# Patient Record
Sex: Male | Born: 1960
Health system: Southern US, Community
[De-identification: ages and names within clinical notes are randomized; demographics above are authoritative.]

## PROBLEM LIST (undated history)

## (undated) DIAGNOSIS — H548 Legal blindness, as defined in USA: Secondary | ICD-10-CM

## (undated) DIAGNOSIS — N200 Calculus of kidney: Secondary | ICD-10-CM

## (undated) DIAGNOSIS — E785 Hyperlipidemia, unspecified: Secondary | ICD-10-CM

## (undated) DIAGNOSIS — M1990 Unspecified osteoarthritis, unspecified site: Secondary | ICD-10-CM

## (undated) DIAGNOSIS — I2510 Atherosclerotic heart disease of native coronary artery without angina pectoris: Secondary | ICD-10-CM

## (undated) DIAGNOSIS — T4145XA Adverse effect of unspecified anesthetic, initial encounter: Secondary | ICD-10-CM

## (undated) DIAGNOSIS — L659 Nonscarring hair loss, unspecified: Secondary | ICD-10-CM

## (undated) DIAGNOSIS — K219 Gastro-esophageal reflux disease without esophagitis: Secondary | ICD-10-CM

## (undated) DIAGNOSIS — R002 Palpitations: Secondary | ICD-10-CM

## (undated) DIAGNOSIS — Z8701 Personal history of pneumonia (recurrent): Secondary | ICD-10-CM

## (undated) DIAGNOSIS — H409 Unspecified glaucoma: Secondary | ICD-10-CM

## (undated) DIAGNOSIS — G43409 Hemiplegic migraine, not intractable, without status migrainosus: Secondary | ICD-10-CM

## (undated) DIAGNOSIS — I10 Essential (primary) hypertension: Secondary | ICD-10-CM

## (undated) DIAGNOSIS — G4730 Sleep apnea, unspecified: Secondary | ICD-10-CM

## (undated) DIAGNOSIS — J189 Pneumonia, unspecified organism: Secondary | ICD-10-CM

## (undated) DIAGNOSIS — T8859XA Other complications of anesthesia, initial encounter: Secondary | ICD-10-CM

## (undated) HISTORY — DX: Personal history of pneumonia (recurrent): Z87.01

## (undated) HISTORY — PX: DG GREAT TOE RIGHT FOOT: HXRAD1657

## (undated) HISTORY — DX: Hemiplegic migraine, not intractable, without status migrainosus: G43.409

## (undated) HISTORY — DX: Calculus of kidney: N20.0

## (undated) HISTORY — DX: Essential (primary) hypertension: I10

## (undated) HISTORY — DX: Atherosclerotic heart disease of native coronary artery without angina pectoris: I25.10

## (undated) HISTORY — PX: LUMBAR LAMINECTOMY: SHX95

## (undated) HISTORY — DX: Hyperlipidemia, unspecified: E78.5

## (undated) HISTORY — DX: Palpitations: R00.2

## (undated) HISTORY — DX: Unspecified glaucoma: H40.9

## (undated) HISTORY — PX: BACK SURGERY: SHX140

---

## 1987-08-05 DIAGNOSIS — H548 Legal blindness, as defined in USA: Secondary | ICD-10-CM

## 1987-08-05 HISTORY — PX: EYE SURGERY: SHX253

## 1987-08-05 HISTORY — DX: Legal blindness, as defined in USA: H54.8

## 1988-08-04 HISTORY — PX: NASAL SEPTUM SURGERY: SHX37

## 1999-02-23 ENCOUNTER — Encounter: Payer: Self-pay | Admitting: Emergency Medicine

## 1999-02-23 ENCOUNTER — Emergency Department (HOSPITAL_COMMUNITY): Admission: EM | Admit: 1999-02-23 | Discharge: 1999-02-23 | Payer: Self-pay | Admitting: Emergency Medicine

## 2000-12-29 ENCOUNTER — Emergency Department (HOSPITAL_COMMUNITY): Admission: EM | Admit: 2000-12-29 | Discharge: 2000-12-30 | Payer: Self-pay | Admitting: Emergency Medicine

## 2003-09-20 ENCOUNTER — Emergency Department (HOSPITAL_COMMUNITY): Admission: EM | Admit: 2003-09-20 | Discharge: 2003-09-20 | Payer: Self-pay | Admitting: Emergency Medicine

## 2004-07-10 ENCOUNTER — Ambulatory Visit: Payer: Self-pay | Admitting: Family Medicine

## 2004-07-16 ENCOUNTER — Ambulatory Visit: Payer: Self-pay | Admitting: Family Medicine

## 2004-09-10 ENCOUNTER — Ambulatory Visit: Payer: Self-pay | Admitting: Family Medicine

## 2004-10-08 ENCOUNTER — Ambulatory Visit: Payer: Self-pay | Admitting: Family Medicine

## 2004-12-18 ENCOUNTER — Ambulatory Visit: Payer: Self-pay | Admitting: Family Medicine

## 2005-01-09 ENCOUNTER — Ambulatory Visit: Payer: Self-pay | Admitting: Family Medicine

## 2005-02-17 ENCOUNTER — Ambulatory Visit: Payer: Self-pay | Admitting: Family Medicine

## 2005-04-08 ENCOUNTER — Ambulatory Visit: Payer: Self-pay | Admitting: Family Medicine

## 2005-07-31 ENCOUNTER — Ambulatory Visit: Payer: Self-pay | Admitting: Family Medicine

## 2005-08-19 ENCOUNTER — Emergency Department (HOSPITAL_COMMUNITY): Admission: EM | Admit: 2005-08-19 | Discharge: 2005-08-20 | Payer: Self-pay | Admitting: Emergency Medicine

## 2005-08-21 ENCOUNTER — Ambulatory Visit: Payer: Self-pay | Admitting: Family Medicine

## 2005-09-05 ENCOUNTER — Ambulatory Visit: Payer: Self-pay | Admitting: Family Medicine

## 2008-12-01 ENCOUNTER — Encounter: Admission: RE | Admit: 2008-12-01 | Discharge: 2008-12-01 | Payer: Self-pay | Admitting: Orthopedic Surgery

## 2010-06-14 ENCOUNTER — Ambulatory Visit: Payer: Self-pay | Admitting: Cardiology

## 2010-06-14 ENCOUNTER — Inpatient Hospital Stay (HOSPITAL_COMMUNITY): Admission: EM | Admit: 2010-06-14 | Discharge: 2010-06-15 | Payer: Self-pay | Admitting: Emergency Medicine

## 2010-06-15 ENCOUNTER — Encounter (INDEPENDENT_AMBULATORY_CARE_PROVIDER_SITE_OTHER): Payer: Self-pay | Admitting: Neurology

## 2010-06-20 ENCOUNTER — Ambulatory Visit (HOSPITAL_COMMUNITY): Admission: RE | Admit: 2010-06-20 | Discharge: 2010-06-20 | Payer: Self-pay | Admitting: Neurology

## 2010-06-20 ENCOUNTER — Encounter (INDEPENDENT_AMBULATORY_CARE_PROVIDER_SITE_OTHER): Payer: Self-pay | Admitting: Neurology

## 2010-09-09 ENCOUNTER — Emergency Department (HOSPITAL_COMMUNITY): Payer: 59

## 2010-09-09 ENCOUNTER — Other Ambulatory Visit: Payer: Self-pay | Admitting: Emergency Medicine

## 2010-09-09 ENCOUNTER — Observation Stay (HOSPITAL_COMMUNITY)
Admission: EM | Admit: 2010-09-09 | Discharge: 2010-09-11 | DRG: 069 | Disposition: A | Discharge status: Home / Self Care | Payer: 59 | Attending: Internal Medicine | Admitting: Internal Medicine

## 2010-09-09 DIAGNOSIS — G459 Transient cerebral ischemic attack, unspecified: Principal | ICD-10-CM | POA: Insufficient documentation

## 2010-09-09 DIAGNOSIS — R079 Chest pain, unspecified: Secondary | ICD-10-CM | POA: Insufficient documentation

## 2010-09-09 DIAGNOSIS — N529 Male erectile dysfunction, unspecified: Secondary | ICD-10-CM | POA: Insufficient documentation

## 2010-09-09 DIAGNOSIS — F3289 Other specified depressive episodes: Secondary | ICD-10-CM | POA: Insufficient documentation

## 2010-09-09 DIAGNOSIS — R51 Headache: Secondary | ICD-10-CM | POA: Insufficient documentation

## 2010-09-09 DIAGNOSIS — J329 Chronic sinusitis, unspecified: Secondary | ICD-10-CM | POA: Insufficient documentation

## 2010-09-09 DIAGNOSIS — F329 Major depressive disorder, single episode, unspecified: Secondary | ICD-10-CM | POA: Insufficient documentation

## 2010-09-09 DIAGNOSIS — I10 Essential (primary) hypertension: Secondary | ICD-10-CM | POA: Insufficient documentation

## 2010-09-09 DIAGNOSIS — H409 Unspecified glaucoma: Secondary | ICD-10-CM | POA: Insufficient documentation

## 2010-09-09 DIAGNOSIS — R002 Palpitations: Secondary | ICD-10-CM | POA: Insufficient documentation

## 2010-09-09 DIAGNOSIS — E785 Hyperlipidemia, unspecified: Secondary | ICD-10-CM | POA: Insufficient documentation

## 2010-09-09 LAB — HEMOGLOBIN A1C: Mean Plasma Glucose: 117 mg/dL — ABNORMAL HIGH (ref <117)

## 2010-09-09 LAB — COMPREHENSIVE METABOLIC PANEL
Albumin: 3.7 g/dL (ref 3.5–5.2)
Alkaline Phosphatase: 49 U/L (ref 39–117)
BUN: 15 mg/dL (ref 6–23)
CO2: 25 mEq/L (ref 19–32)
Chloride: 103 mEq/L (ref 96–112)
GFR calc non Af Amer: >60 mL/min (ref >60)
Glucose, Bld: 86 mg/dL (ref 70–99)
Potassium: 3.7 mEq/L (ref 3.5–5.1)
Total Bilirubin: 0.4 mg/dL (ref 0.3–1.2)

## 2010-09-09 LAB — POCT CARDIAC MARKERS
CKMB, poc: 4.3 ng/mL (ref 1.0–8.0)
Troponin i, poc: <0.05 ng/mL (ref 0.00–0.09)

## 2010-09-09 LAB — POCT I-STAT, CHEM 8
Calcium, Ion: 1.22 mmol/L (ref 1.12–1.32)
Chloride: 104 mEq/L (ref 96–112)
Creatinine, Ser: 1.3 mg/dL (ref 0.4–1.5)
Glucose, Bld: 84 mg/dL (ref 70–99)
Potassium: 3.5 mEq/L (ref 3.5–5.1)

## 2010-09-09 LAB — CK TOTAL AND CKMB (NOT AT ARMC)
CK, MB: 9.2 ng/mL (ref 0.3–4.0)
Total CK: 194 U/L (ref 7–232)

## 2010-09-09 LAB — PROTIME-INR
INR: 0.98 (ref 0.00–1.49)
Prothrombin Time: 13.2 seconds (ref 11.6–15.2)

## 2010-09-09 LAB — APTT: aPTT: 25 seconds (ref 24–37)

## 2010-09-10 DIAGNOSIS — G459 Transient cerebral ischemic attack, unspecified: Secondary | ICD-10-CM

## 2010-09-10 LAB — CARDIAC PANEL(CRET KIN+CKTOT+MB+TROPI)
CK, MB: 9 ng/mL (ref 0.3–4.0)
Relative Index: 4.5 — ABNORMAL HIGH (ref 0.0–2.5)
Relative Index: 5 — ABNORMAL HIGH (ref 0.0–2.5)
Total CK: 180 U/L (ref 7–232)
Troponin I: 0.01 ng/mL (ref 0.00–0.06)

## 2010-09-10 LAB — CBC
HCT: 41.3 % (ref 39.0–52.0)
Hemoglobin: 14.5 g/dL (ref 13.0–17.0)
MCHC: 35.1 g/dL (ref 30.0–36.0)

## 2010-09-10 LAB — BASIC METABOLIC PANEL
CO2: 27 mEq/L (ref 19–32)
Calcium: 9.1 mg/dL (ref 8.4–10.5)
Glucose, Bld: 88 mg/dL (ref 70–99)
Sodium: 137 mEq/L (ref 135–145)

## 2010-09-10 LAB — LIPID PANEL
HDL: 36 mg/dL — ABNORMAL LOW (ref >39)
Triglycerides: 121 mg/dL (ref <150)
VLDL: 24 mg/dL (ref 0–40)

## 2010-09-11 DIAGNOSIS — I6789 Other cerebrovascular disease: Secondary | ICD-10-CM

## 2010-09-11 LAB — BASIC METABOLIC PANEL
Calcium: 9.3 mg/dL (ref 8.4–10.5)
GFR calc Af Amer: >60 mL/min (ref >60)
GFR calc non Af Amer: >60 mL/min (ref >60)
Sodium: 139 mEq/L (ref 135–145)

## 2010-09-11 LAB — CARDIAC PANEL(CRET KIN+CKTOT+MB+TROPI)
CK, MB: 6.8 ng/mL (ref 0.3–4.0)
Total CK: 132 U/L (ref 7–232)
Troponin I: 0.01 ng/mL (ref 0.00–0.06)

## 2010-09-24 NOTE — Discharge Summary (Signed)
[name], [name]           ACCOUNT NO.:  [id number]  MEDICAL RECORD NO.:  [id number]           PATIENT TYPE:  I  LOCATION:  3731                         FACILITY:  [facility]  PHYSICIAN:  Doneen Poisson, MD     DATE OF BIRTH:  January 07, 1961  DATE OF ADMISSION:  09/09/2010 DATE OF DISCHARGE:  09/11/2010                              DISCHARGE SUMMARY   DISCHARGE DIAGNOSES: 1. Transient ischemic attacks, recurrent. 2. Hypertension. 3. Hyperlipidemia. 4. Glaucoma. 5. Chronic sinusitis.  DISCHARGE MEDICATIONS: 1. Plavix 75 mg p.o. daily. 2. Hydrochlorothiazide 12.5 mg p.o. daily. 3. Famotidine 20 mg p.o. daily. 4. Fenofibrate 145 mg p.o. daily. 5. Fish oil 1200 mg p.o. daily. 6. Ibuprofen 200 mg 3 tablets p.o. daily. 7. Simvastatin 40 mg p.o. daily. 8. Singulair 10 mg p.o. daily. 9. Xalatan ophthalmic drops 1 drop in each eye daily at bedtime.  DISPOSITION AND FOLLOWUP:  [name] was discharged from [facility] on September 11, 2010, in stable and improved condition.  He had no residual weakness, focal neurologic signs, or chest pain.  He will follow up with Dr. Pearlean Brownie at [facility] for management of recurrent TIAs.  Following discharge, he will be set up with a cardiac event monitor to further evaluate his history of palpitations in the setting of recurrent TIAs.  He was also informed that he should undergo repeat cardiac stress testing in the next few months.  He will also follow up with Prudy Feeler, PA of [facility] to establish care with a primary care provider. At that time, his blood pressure control, need for cardiac stress testing and any ongoing depressive symptoms or sexual dysfunction should be addressed.  CONSULTANTS:  Neurology, Joycelyn Schmid, MD and Pramod P. Pearlean Brownie, MD  PROCEDURES PERFORMED: 1. Portable chest x-ray on February 6, which demonstrated no acute     cardiopulmonary findings. 2. MRI of brain on February  6, which demonstrated no acute infarct,     but minimal nonspecific white matter-type changes, mild global     atrophy and polypoid opacification of the inferior aspect of the     left maxillary sinus.  Mild mucosal thickening of the ethmoid sinus     air cells also observed. 3. MRA of head on February 6, which demonstrated mild branch vessel     irregularity.  ADMISSION HISTORY:  [name] is a 50 year old man with a past medical history of hypertension, hyperlipidemia, and recurrent TIAs who came to the ED for left-sided extremity weakness.  He had been working at approximately 12:30 in the afternoon on that day when he had an episode of sudden-onset left upper and lower extremity weakness, which lasted approximately 40 minutes and resolved spontaneously.  At that time, he also had mild headache, no dizziness, slurred speech, seizure, or syncope.  He was concerned because he had 2 previous TIAs in November and December 2011, and therefore went to the ED.  He had a full work up including MRI and MRA and intracranial Doppler in November 2011, which were unremarkable and had been followed by Dr. Pearlean Brownie at [facility].  After his arrival at the  ED, his weakness resolved, but he started to have left-sided chest pain, intermittent, lasting about 5 minutes without any precipitating factors.  The pain was approximately 5/10, pressure like, also associated with palpitation but no radiation, diaphoresis, nausea or vomiting, fever or cough.  No abdominal pain, diarrhea, or dysuria.  He denied smoking, alcohol or drug abuse.  ADMITTING PHYSICAL EXAMINATION: VITAL SIGNS:  Blood pressure 137/95, pulse 125, respiratory rate 22,  temperature 98.3, oxygen saturation 96% on room air. GENERAL:  No acute distress.  Normal speech. HEAD:  Normocephalic, atraumatic. EYES:  Left pupil responded.  Right pupil nonreactive secondary to a remote retinal tear.  Extraocular motions intact.   No icterus or pallor. NECK:  Supple. LUNGS:  Bibasilar fine crackles.  Poor inspiratory effort.  No increased work of breathing. CARDIOVASCULAR:  Regular rate and rhythm.  No murmurs, rubs, or gallops. Normal S1 and S2. ABDOMEN:  Soft, nontender, nondistended.  Normal bowel sounds.  No palpable masses. EXTREMITIES:  No edema or rash. NEUROLOGIC:  Alert and oriented.  Muscle strength 5/5 in all extremities.  Sensation and deep tendon reflexes unremarkable. SKIN:  No rash or lesions.  ADMITTING LABORATORY DATA:  Sodium 140, potassium 3.7, chloride 103, bicarb 25, BUN 15, creatinine 1.06, glucose 86, bilirubin 0.4, alkaline phosphatase 49, AST 23, ALT 31, total protein 6.2, albumin 3.7, calcium 9.2.  White blood cell count 9.2, hemoglobin 14.5, hematocrit 41.3, platelet count 256.  HOSPITAL COURSE: 1. Recurrent TIAs.  [name] presented to the emergency department     with symptoms of acute left upper and lower extremity weakness that     resolved spontaneously after approximately 40 minutes.  MRI and MRA     of head were negative for acute infarct.  Carotid Dopplers and 2-D     echo were also unremarkable.  He was monitored on telemetry     throughout admission and remained in normal sinus rhythm without     significant events.  He had previously been hospitalized in     November 2011 with similar TIA symptoms including left-sided     weakness and slurred speech.  He also reported an episode in     December 2011 with similar symptoms that lasted only a few minutes.     Given the history of recurrent TIAs and his report of occasional      palpitations at home, he underwent a transesophageal     echocardiogram that demonstrated no significant abnormalities.     Prior to admission, he had been participating in the Point     Trial for the past few months, which involved administration of     daily aspirin and study drug, which was either Plavix or placebo.     Per Dr. Pearlean Brownie, he  was removed from the [facility] Trial,     stopped taking the study drug and was started on Plavix without     aspirin as he had presumably failed aspirin therapy.     Although, it is unknown whether he had been receiving     Plavix and aspirin or aspirin alone through Point Trial.  It was     decided that unblinding of his assignment in the trial was not     necessary as there is currently no therapy superior to Plavix to     offer the patients for stroke and TIA prevention.  Following     discharge, he will follow up with Dr. Pearlean Brownie in clinic for further  management of recurrent TIAs.  2. Palpitations.  [name] reports occasional subjective     palpitations at home.  On admission, he was tachycardic with a     heart rate of 125, however, his rate returned to normal before the     rhythm could be analyzed.  EKG demonstrated normal sinus rhythm and     he remained in normal sinus rhythm on telemetry throughout     admission, although transesophageal echocardiogram was     unremarkable.  Given the history of recurrent TIAs and subjective     palpitations, it was decided he would benefit from further     cardiac monitoring. Following discharge, arrangements will be made     for him to be set up with a non-looping cardiac event monitor.  3. Chest pain.  While in the ED, he complained of left-sided     pressure-like chest pain that was intermittent.  He had no evidence      of ischemia on EKG and cardiac enzymes were negative x3.  He     reported a negative exercise stress test that was performed     several years ago at [facility].  Given numerous risk factors     including apparent cerebrovascular disease, he was advised     to undergo repeat exercise stress testing after completion of     cardiac event monitoring.  4. Hypertension.  Antihypertensives were initially held on admission     in the context of TIA symptoms.  His blood pressure remained only     slightly elevated despite  receiving no antihypertensives.  The     patient reported occasional dizziness at home and it was felt that     his previous regimen of losartan/HCTZ 100/25 mg, may be more     aggressive than he currently needed for blood pressure control.       He was discharged on a reduced dose of HCTZ alone.  Blood pressure     control should be further addressed by his primary care provider on     follow up.  5. Hyperlipidemia.  [name] was continued on statin therapy     throughout admission and at discharge.  Fasting lipid panel     demonstrated total cholesterol 157, HDL 37, LDL 42, and     triglycerides 332.  6. Depressive symptoms.  Prior to discharge, he and his     family expressed concerns that he may be suffering from depression.     He endorsed frequent irritability, poor sleep, and difficulty with     stress related to his job.  The possibility of starting an SSRI was     discussed with him, and he elected to think about it and     follow up with his primary care doctor.  7. Erectile dysfunction.  On the evening of discharge, [name]     reported concerns related to erectile dysfunction and low sexual     desire over the past several months.  Possible etiologies including     depression, vascular disease, and medications were discussed with     him.  He was advised to wait on trying medications     such as Viagra until his cardiac work up was completed.  This     problem should be further addressed by his primary care provider at     follow up.  DISCHARGE VITALS:  Temperature 98.1, blood pressure 118/62, pulse 76, respiratory rate  18, oxygen saturation 96% on room air.  DISCHARGE LABORATORIES:  Sodium 139, potassium 3.7, chloride 105, bicarb 28, BUN 13, creatinine 1.11, glucose 109, calcium 9.3.   ______________________________ Whitney Post, MD   ______________________________ Doneen Poisson, MD   EB/MEDQ  D:  09/15/2010  T:  09/16/2010  Job:  [id number]  cc:    Pramod P. Pearlean Brownie, MD Prudy Feeler, PA  Electronically Signed by Whitney Post MD on 09/22/2010 08:41:04 PM Electronically Signed by Doneen Poisson  on 09/24/2010 06:55:43 AM
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
## 2010-10-15 LAB — DIFFERENTIAL
Basophils Relative: 0 % (ref 0–1)
Eosinophils Absolute: 0.2 10*3/uL (ref 0.0–0.7)
Eosinophils Relative: 3 % (ref 0–5)
Monocytes Absolute: 0.7 10*3/uL (ref 0.1–1.0)
Monocytes Relative: 9 % (ref 3–12)

## 2010-10-15 LAB — LIPID PANEL
HDL: 37 mg/dL — ABNORMAL LOW (ref >39)
Total CHOL/HDL Ratio: 4.2 RATIO

## 2010-10-15 LAB — COMPREHENSIVE METABOLIC PANEL
ALT: 36 U/L (ref 0–53)
Albumin: 3.7 g/dL (ref 3.5–5.2)
Alkaline Phosphatase: 79 U/L (ref 39–117)
Potassium: 3.8 mEq/L (ref 3.5–5.1)
Sodium: 137 mEq/L (ref 135–145)
Total Protein: 6.1 g/dL (ref 6.0–8.3)

## 2010-10-15 LAB — CARDIAC PANEL(CRET KIN+CKTOT+MB+TROPI)
CK, MB: 8.8 ng/mL (ref 0.3–4.0)
CK, MB: 9.9 ng/mL (ref 0.3–4.0)

## 2010-10-15 LAB — CBC
MCH: 28.2 pg (ref 26.0–34.0)
MCHC: 35.4 g/dL (ref 30.0–36.0)
Platelets: 210 10*3/uL (ref 150–400)

## 2010-10-15 LAB — HEMOGLOBIN A1C: Mean Plasma Glucose: 114 mg/dL (ref <117)

## 2010-10-15 LAB — URINALYSIS, ROUTINE W REFLEX MICROSCOPIC
Ketones, ur: NEGATIVE mg/dL
Nitrite: NEGATIVE
Specific Gravity, Urine: 1.024 (ref 1.005–1.030)
pH: 6.5 (ref 5.0–8.0)

## 2010-10-15 LAB — CK TOTAL AND CKMB (NOT AT ARMC)
CK, MB: 12.2 ng/mL (ref 0.3–4.0)
Relative Index: 4.9 — ABNORMAL HIGH (ref 0.0–2.5)

## 2010-11-18 LAB — DIFFERENTIAL

## 2010-11-18 LAB — CBC
MCV: 80.8 fL (ref 78.0–100.0)
Platelets: 275 10*3/uL (ref 150–400)
RBC: 5.42 MIL/uL (ref 4.22–5.81)
RDW: 12.6 % (ref 11.5–15.5)
WBC: 9.9 10*3/uL (ref 4.0–10.5)

## 2010-11-18 LAB — D-DIMER, QUANTITATIVE: D-Dimer, Quant: <0.22 ug/mL-FEU (ref 0.00–0.48)

## 2011-06-19 ENCOUNTER — Encounter: Payer: Self-pay | Admitting: Internal Medicine

## 2011-06-20 ENCOUNTER — Ambulatory Visit (INDEPENDENT_AMBULATORY_CARE_PROVIDER_SITE_OTHER): Payer: 59 | Admitting: Internal Medicine

## 2011-06-20 ENCOUNTER — Encounter: Payer: Self-pay | Admitting: Internal Medicine

## 2011-06-20 VITALS — BP 130/108 | HR 78 | Temp 98.0°F | Ht 69.0 in | Wt 225.0 lb

## 2011-06-20 DIAGNOSIS — R05 Cough: Secondary | ICD-10-CM

## 2011-06-20 DIAGNOSIS — R059 Cough, unspecified: Secondary | ICD-10-CM

## 2011-06-20 MED ORDER — FAMOTIDINE 20 MG PO TABS: ORAL_TABLET | ORAL | Status: DC

## 2011-06-20 MED ORDER — TRAMADOL HCL 50 MG PO TABS: ORAL_TABLET | ORAL | Status: AC

## 2011-06-20 MED ORDER — PANTOPRAZOLE SODIUM 40 MG PO TBEC: 40.0000 mg | DELAYED_RELEASE_TABLET | Freq: Every day | ORAL | Status: DC

## 2011-06-20 NOTE — Patient Instructions (Signed)
Take delsym two tsp every 12 hours and supplement if needed with  tramadol 50 mg up to 2 every 4 hours to suppress the urge to cough. Swallowing water or using ice chips/non mint and menthol containing candies (such as lifesavers or sugarless jolly ranchers) are also effective.  You should rest your voice and avoid activities that you know make you cough.  Once you have eliminated the cough for 3 straight days try reducing the tramadol first,  then the delsym as tolerated.    Finish your prednisone  Try protonix 40 mg  Take 30-60 min before first meal of the day and Pepcid 20 mg one bedtime until return  GERD (REFLUX)  is an extremely common cause of respiratory symptoms, many times with no significant heartburn at all.    It can be treated with medication, but also with lifestyle changes including avoidance of late meals, excessive alcohol, smoking cessation, and avoid fatty foods, chocolate, peppermint, colas, red wine, and acidic juices such as orange juice.  NO MINT OR MENTHOL PRODUCTS SO NO COUGH DROPS  USE SUGARLESS CANDY INSTEAD (jolley ranchers or Stover's)  NO OIL BASED VITAMINS - use powdered substitutes.  NO FISH OIL, minimize motrin  Stop fish oil  Please schedule a follow up office visit in 2 weeks, sooner if needed
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
## 2011-06-22 DIAGNOSIS — R05 Cough: Secondary | ICD-10-CM | POA: Insufficient documentation

## 2011-06-22 DIAGNOSIS — R059 Cough, unspecified: Secondary | ICD-10-CM | POA: Insufficient documentation

## 2011-06-22 NOTE — Assessment & Plan Note (Signed)
The most common causes of chronic cough in immunocompetent adults include the following: upper airway cough syndrome (UACS), previously referred to as postnasal drip syndrome (PNDS), which is caused by variety of rhinosinus conditions; (2) asthma; (3) GERD; (4) chronic bronchitis from cigarette smoking or other inhaled environmental irritants; (5) nonasthmatic eosinophilic bronchitis; and (6) bronchiectasis.   These conditions, singly or in combination, have accounted for up to 94% of the causes of chronic cough in prospective studies.   Other conditions have constituted no >6% of the causes in prospective studies These have included bronchogenic carcinoma, chronic interstitial pneumonia, sarcoidosis, left ventricular failure, ACEI-induced cough, and aspiration from a condition associated with pharyngeal dysfunction.  Of the three most common causes of chronic cough, only one (GERD)  can actually cause the other two (asthma and post nasal drip syndrome)  and perpetuate the cylce of cough inducing airway trauma, inflammation, heightened sensitivity to reflux which is prompted by the cough itself via a cyclical mechanism.    This may partially respond to steroids and look like asthma and post nasal drainage but never erradicated completely unless the cough and the secondary reflux are eliminated, preferably both at the same time.  While not intuitively obvious, many patients with chronic low grade reflux do not cough until there is a secondary insult that disturbs the protective epithelial barrier and exposes sensitive nerve endings.  This can be viral or direct physical injury such as with an endotracheal tube.   The point is that once this occurs, it is difficult to eliminate using anything but a maximally effective acid suppression regimen at least in the short run, accompanied by an appropriate diet to address non acid GERD.   See instructions for specific recommendations which were reviewed directly  with the patient who was given a copy with highlighter outlining the key components.  

## 2011-08-29 ENCOUNTER — Other Ambulatory Visit: Payer: Self-pay | Admitting: Internal Medicine

## 2011-11-01 IMAGING — CT CT HEAD W/O CM
1 series · 16 of 30 positions shown, 20 images · non-contrast
Comparison: None.

CLINICAL DATA: Code stroke.  Left arm weakness and left sided
heaviness.

CT HEAD WITHOUT CONTRAST
TECHNIQUE: Contiguous axial images were obtained from the base of
the skull through the vertex without contrast.

[Series 2: head routine 4.8 h37s · axial · 0.47mm/px · z∈[-111,+44]mm · 16 of 36 slices shown, 20 images]
[im 2/36  brain]
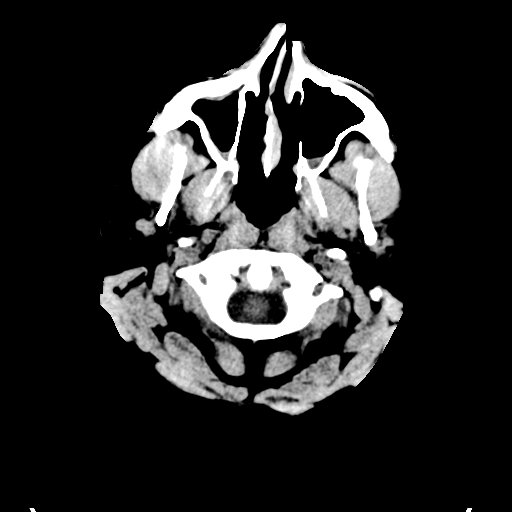
[im 2/36  bone]
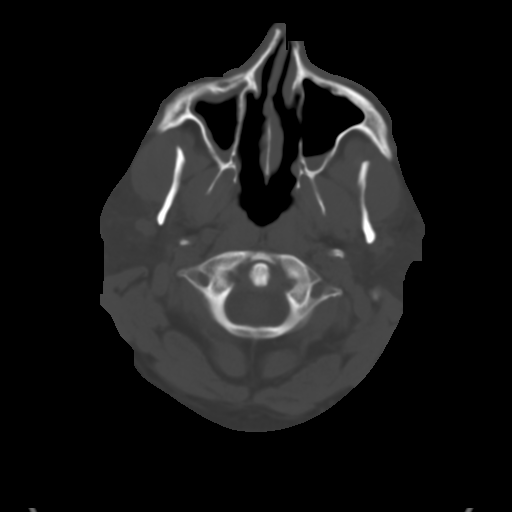
[im 4/36  brain]
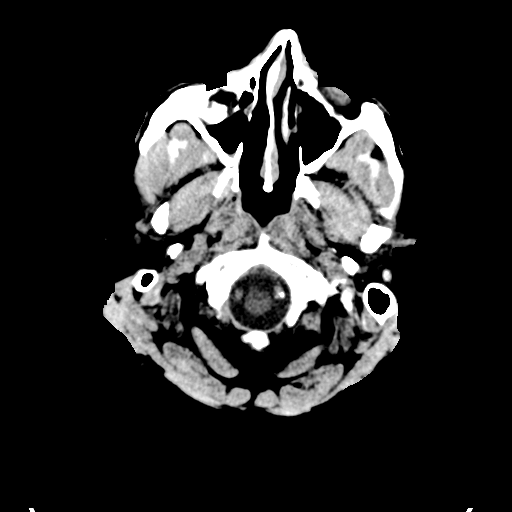
[im 7/36  brain]
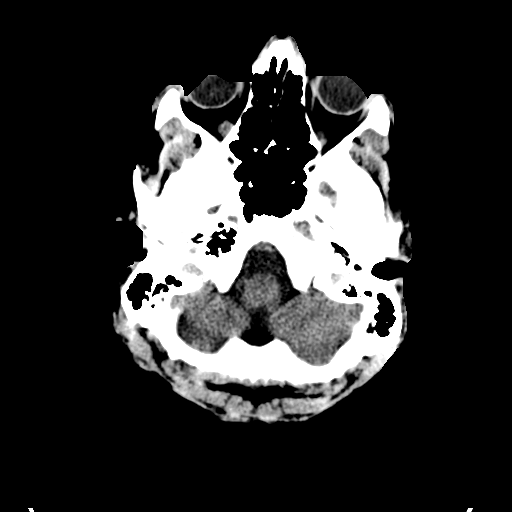
[im 9/36  brain]
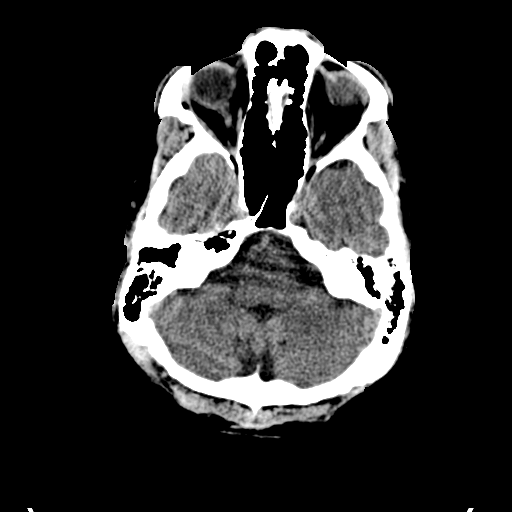
[im 10/36  brain]
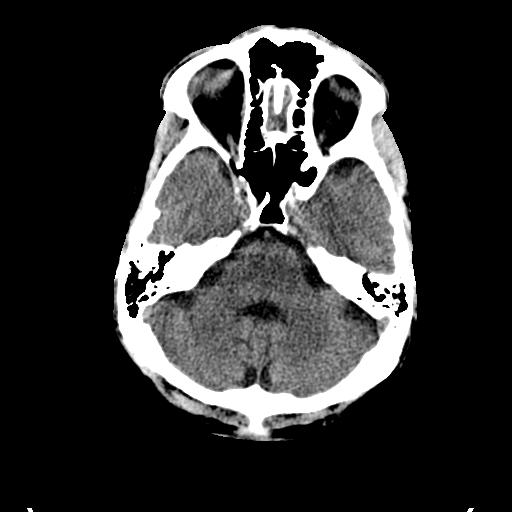
[im 10/36  bone]
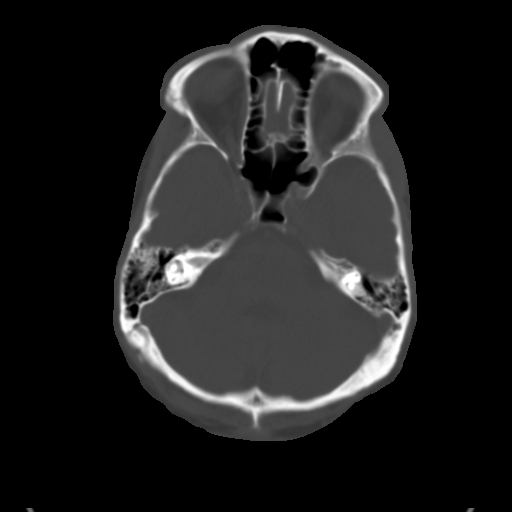
[im 13/36  brain]
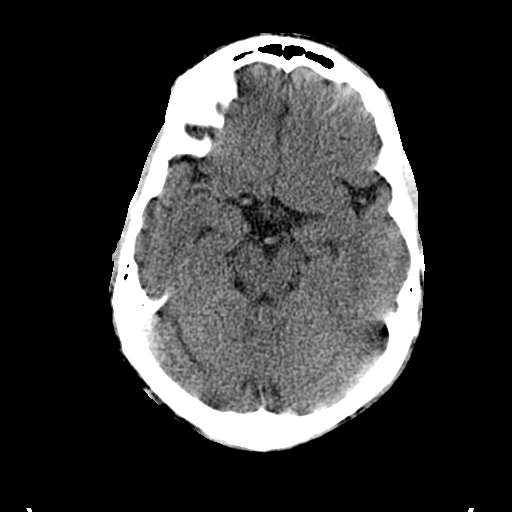
[im 15/36  brain]
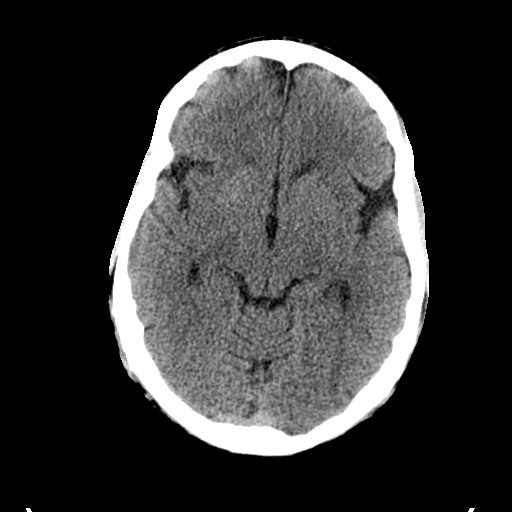
[im 17/36  brain]
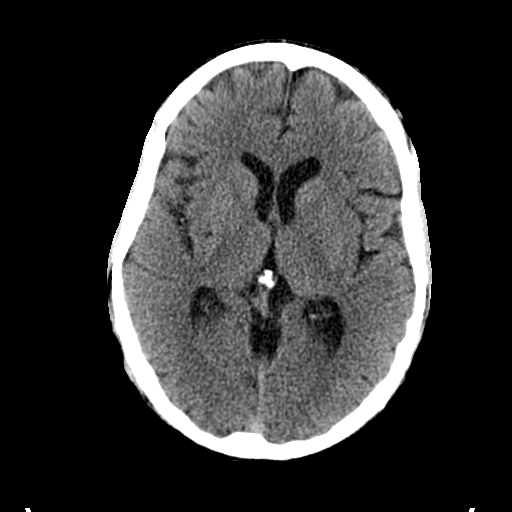
[im 19/36  brain]
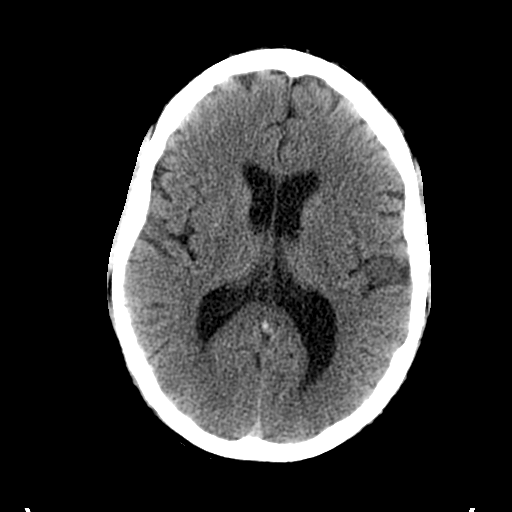
[im 19/36  bone]
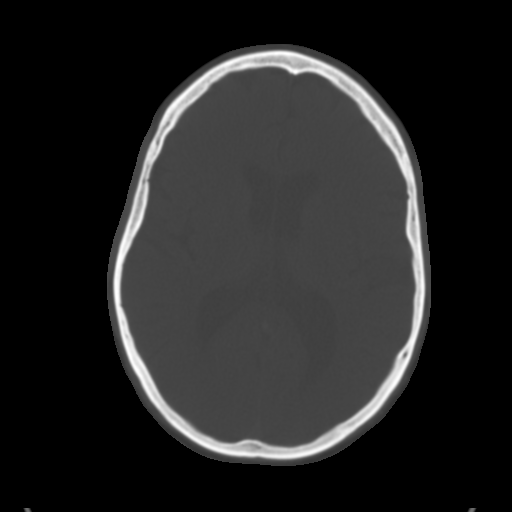
[im 21/36  brain]
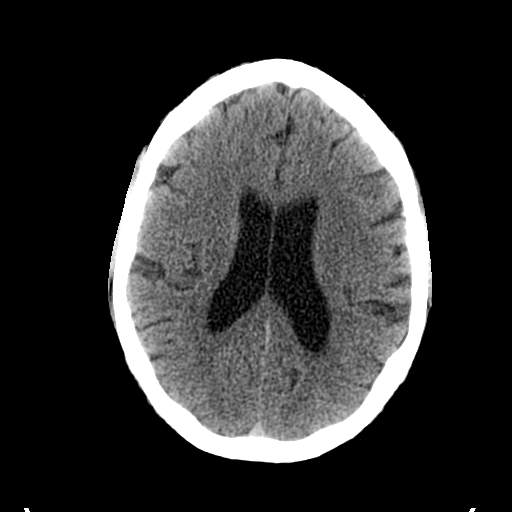
[im 23/36  brain]
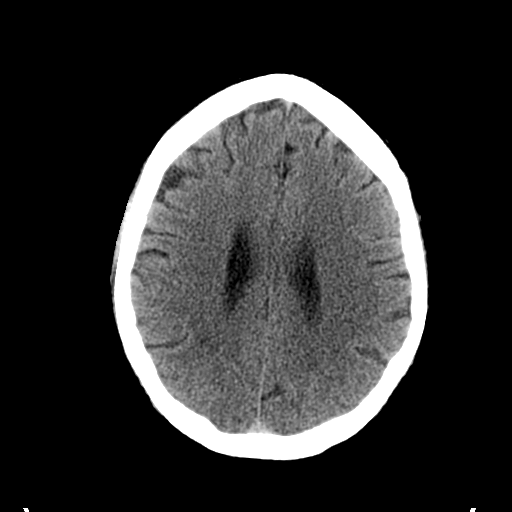
[im 26/36  brain]
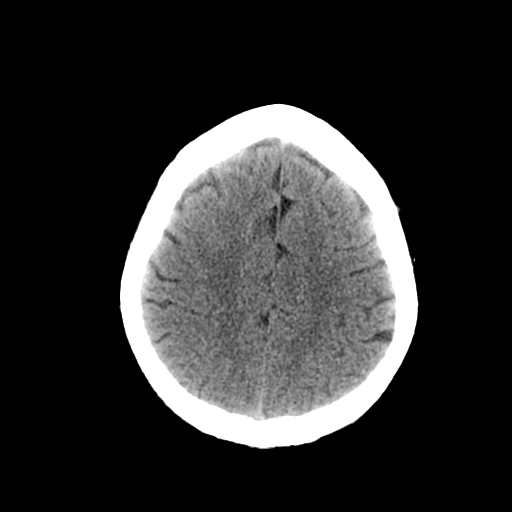
[im 27/36  brain]
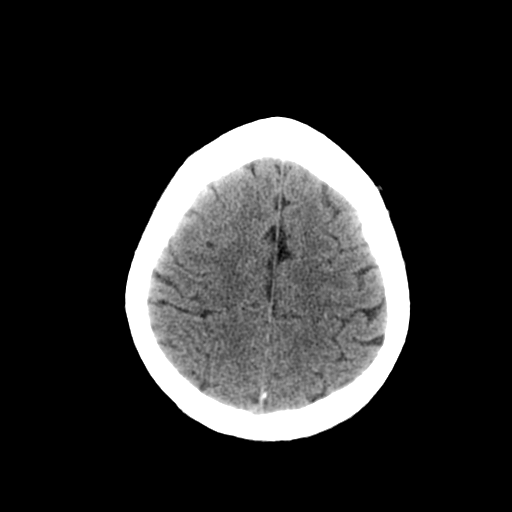
[im 27/36  bone]
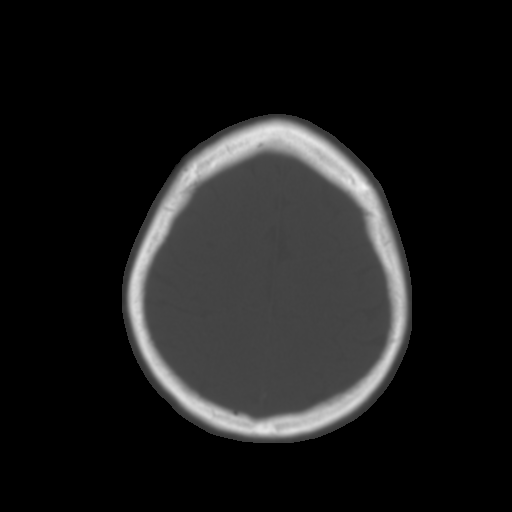
[im 29/36  brain]
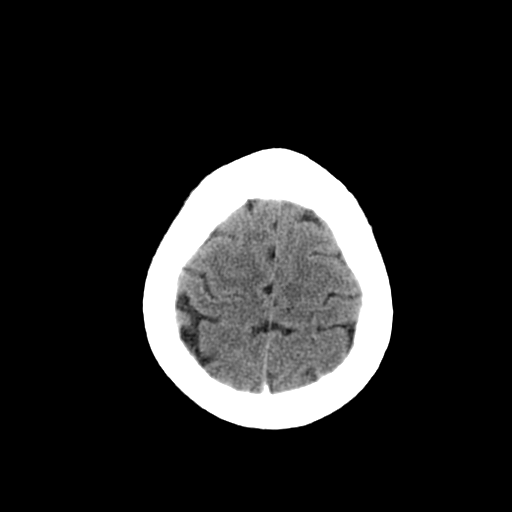
[im 32/36  brain]
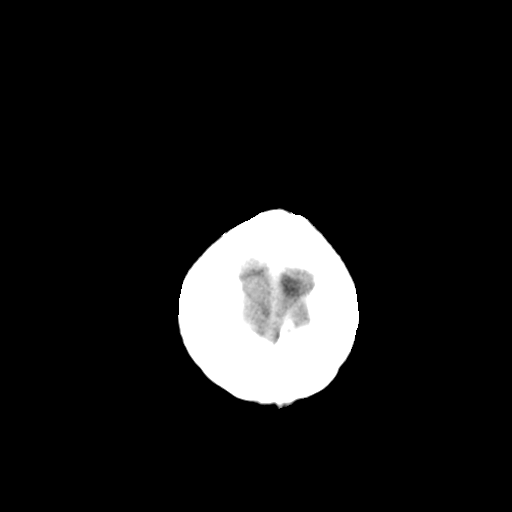
[im 34/36  brain]
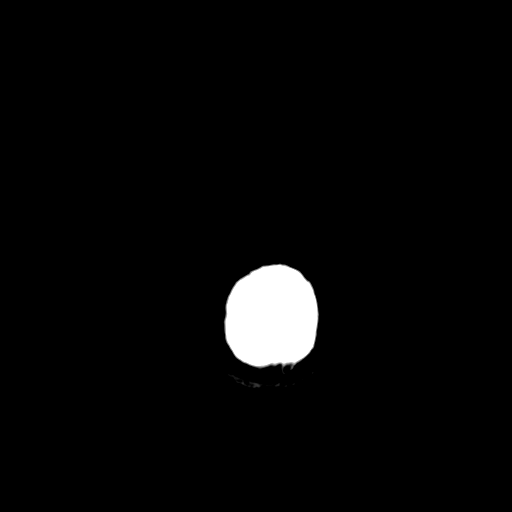

[16 of 30 positions shown; findings below may reference images not displayed]

FINDINGS: There is some prominence of the sulci consistent with
mild cerebral volume loss.  The ventricles are normal in size.

Negative for hemorrhage, hydrocephalus, mass effect, mass lesion,
or evidence of acute cortically based infarct.

Soft tissues of the orbits and scalp are unremarkable.  Prominent
mucosal thickening both maxillary sinuses is present, with
postsurgical changes of bilateral maxillary antrostomies.  There
are no air-fluid levels in the sinuses.  The mastoid air cells and
middle ears are clear.  The skull is intact.
IMPRESSION: No acute intracranial abnormality identified.

Chronic sinusitis, with postsurgical changes of bilateral maxillary
antrostomies.

## 2012-07-03 ENCOUNTER — Emergency Department (HOSPITAL_COMMUNITY): Payer: 59

## 2012-07-03 ENCOUNTER — Encounter (HOSPITAL_COMMUNITY): Payer: Self-pay | Admitting: Emergency Medicine

## 2012-07-03 ENCOUNTER — Emergency Department (HOSPITAL_COMMUNITY)
Admission: EM | Admit: 2012-07-03 | Discharge: 2012-07-03 | Disposition: A | Discharge status: Home / Self Care | Payer: 59 | Attending: Emergency Medicine | Admitting: Emergency Medicine

## 2012-07-03 DIAGNOSIS — Y9229 Other specified public building as the place of occurrence of the external cause: Secondary | ICD-10-CM | POA: Insufficient documentation

## 2012-07-03 DIAGNOSIS — S060X1A Concussion with loss of consciousness of 30 minutes or less, initial encounter: Secondary | ICD-10-CM | POA: Insufficient documentation

## 2012-07-03 DIAGNOSIS — Y93H3 Activity, building and construction: Secondary | ICD-10-CM | POA: Insufficient documentation

## 2012-07-03 DIAGNOSIS — S0990XA Unspecified injury of head, initial encounter: Secondary | ICD-10-CM

## 2012-07-03 DIAGNOSIS — Z791 Long term (current) use of non-steroidal anti-inflammatories (NSAID): Secondary | ICD-10-CM | POA: Insufficient documentation

## 2012-07-03 DIAGNOSIS — S0191XA Laceration without foreign body of unspecified part of head, initial encounter: Secondary | ICD-10-CM

## 2012-07-03 DIAGNOSIS — I10 Essential (primary) hypertension: Secondary | ICD-10-CM | POA: Insufficient documentation

## 2012-07-03 DIAGNOSIS — E785 Hyperlipidemia, unspecified: Secondary | ICD-10-CM | POA: Insufficient documentation

## 2012-07-03 DIAGNOSIS — Z8669 Personal history of other diseases of the nervous system and sense organs: Secondary | ICD-10-CM | POA: Insufficient documentation

## 2012-07-03 DIAGNOSIS — Z79899 Other long term (current) drug therapy: Secondary | ICD-10-CM | POA: Insufficient documentation

## 2012-07-03 DIAGNOSIS — Z8673 Personal history of transient ischemic attack (TIA), and cerebral infarction without residual deficits: Secondary | ICD-10-CM | POA: Insufficient documentation

## 2012-07-03 DIAGNOSIS — Z7982 Long term (current) use of aspirin: Secondary | ICD-10-CM | POA: Insufficient documentation

## 2012-07-03 DIAGNOSIS — S0100XA Unspecified open wound of scalp, initial encounter: Secondary | ICD-10-CM | POA: Insufficient documentation

## 2012-07-03 DIAGNOSIS — W208XXA Other cause of strike by thrown, projected or falling object, initial encounter: Secondary | ICD-10-CM | POA: Insufficient documentation

## 2012-07-03 DIAGNOSIS — Z23 Encounter for immunization: Secondary | ICD-10-CM | POA: Insufficient documentation

## 2012-07-03 MED ORDER — TETANUS-DIPHTH-ACELL PERTUSSIS 5-2.5-18.5 LF-MCG/0.5 IM SUSP
0.5000 mL | Freq: Once | INTRAMUSCULAR | Status: AC
  Administered 2012-07-03: 0.5 mL via INTRAMUSCULAR
  Filled 2012-07-03: qty 0.5

## 2012-07-03 MED ORDER — HYDROCODONE-ACETAMINOPHEN 5-325 MG PO TABS
2.0000 | ORAL_TABLET | Freq: Once | ORAL | Status: AC
  Administered 2012-07-03: 2 via ORAL
  Filled 2012-07-03: qty 2

## 2012-07-03 MED ORDER — HYDROCODONE-ACETAMINOPHEN 5-325 MG PO TABS: 2.0000 | ORAL_TABLET | ORAL | Status: DC | PRN

## 2012-07-03 NOTE — ED Notes (Signed)
Patient transported to CT 

## 2012-07-03 NOTE — ED Notes (Signed)
Pt ambulatory to ED room with c-collar in place

## 2012-07-03 NOTE — ED Provider Notes (Signed)
I saw and evaluated the patient, reviewed the resident's note and I agree with the findings and plan.   Patient had negative x-rays and laceration was repaired  . He has no focal neurological deficits and is stable for discharge  Toy Baker, MD 07/03/12 1554

## 2012-07-03 NOTE — ED Provider Notes (Signed)
History     CSN: [id number]  Arrival date & time 07/03/12  1248   First MD Initiated Contact with Patient 07/03/12 1428      Chief Complaint  Patient presents with  . Head Laceration    (Consider location/radiation/quality/duration/timing/severity/associated sxs/prior treatment) Patient is a 51 y.o. male presenting with head injury. The history is provided by the patient.  Head Injury  The incident occurred 3 to 5 hours ago. He came to the ER via walk-in. The injury mechanism was a direct blow. He lost consciousness for a period of less than one minute. The volume of blood lost was minimal. The quality of the pain is described as sharp and dull. The pain is at a severity of 4/10. The pain is mild. The pain has been constant since the injury. Pertinent negatives include no numbness, no blurred vision, no vomiting, no disorientation, no weakness and no memory loss. He has tried NSAIDs for the symptoms. The treatment provided mild relief.    Past Medical History  Diagnosis Date  . Hemiplegic migraine   . TIA (transient ischemic attack)   . Hyperlipidemia   . Hypertension   . Allergic rhinitis     Past Surgical History  Procedure Date  . Sinus surgery with instatrak   . Lumbar spine surgery     History reviewed. No pertinent family history.  History  Substance Use Topics  . Smoking status: Never Smoker   . Smokeless tobacco: Former Neurosurgeon    Types: Chew    Quit date: 08/04/2008  . Alcohol Use: No      Review of Systems  Constitutional: Negative for fever and chills.  Eyes: Negative for blurred vision.  Respiratory: Negative for cough and shortness of breath.   Gastrointestinal: Negative for vomiting.  Neurological: Negative for weakness and numbness.  Psychiatric/Behavioral: Negative for memory loss.  All other systems reviewed and are negative.    Allergies  Morphine and related  Home Medications   Current Outpatient Rx  Name  Route  Sig  Dispense  Refill    . ASPIRIN 81 MG PO TABS   Oral   Take 81 mg by mouth daily.           Marland Kitchen BENICAR 20 MG PO TABS   Oral   Take 1 tablet by mouth daily.         Marland Kitchen CLOPIDOGREL BISULFATE 75 MG PO TABS   Oral   Take 1 tablet by mouth daily.         Marland Kitchen FAMOTIDINE 20 MG PO TABS      One at bedtime         . IBUPROFEN 200 MG PO TABS   Oral   Take 200 mg by mouth every 6 (six) hours as needed.           Marland Kitchen LATANOPROST 0.005 % OP SOLN   Both Eyes   Place 1 drop into both eyes at bedtime.           Marland Kitchen MONTELUKAST SODIUM 10 MG PO TABS   Oral   Take 1 tablet by mouth daily.         . MULTI-VITAMIN/MINERALS PO TABS   Oral   Take 1 tablet by mouth daily.           Marland Kitchen PANTOPRAZOLE SODIUM 40 MG PO TBEC   Oral   Take 1 tablet (40 mg total) by mouth daily.   30 tablet   1   . SIMVASTATIN  40 MG PO TABS   Oral   Take 1 tablet by mouth daily.           BP 144/104  Pulse 90  Temp 98 F (36.7 C) (Oral)  Resp 18  SpO2 96%  Physical Exam  Nursing note and vitals reviewed. Constitutional: He is oriented to person, place, and time. He appears well-developed and well-nourished. No distress.  HENT:  Head: Normocephalic and atraumatic.    Right Ear: No hemotympanum.  Left Ear: No hemotympanum.  Mouth/Throat: No oropharyngeal exudate.  Eyes: EOM are normal. Pupils are equal, round, and reactive to light.  Neck: Normal range of motion. Neck supple. No JVD present. Spinous process tenderness (lower c-spine) and muscular tenderness (diffuse neck) present. No tracheal deviation present. No thyromegaly present.  Cardiovascular: Normal rate and regular rhythm.  Exam reveals no friction rub.   No murmur heard. Pulmonary/Chest: Effort normal and breath sounds normal. No respiratory distress. He has no wheezes. He has no rales.  Abdominal: He exhibits no distension. There is no tenderness. There is no rebound.  Musculoskeletal: Normal range of motion. He exhibits no edema.  Neurological: He  is alert and oriented to person, place, and time.  Skin: He is not diaphoretic.    ED Course  LACERATION REPAIR Date/Time: 07/03/2012 3:30 PM Performed by: Elwin Mocha Authorized by: Lorre Nick T Consent: Verbal consent obtained. Risks and benefits: risks, benefits and alternatives were discussed Body area: head/neck Location details: scalp Laceration length: 3 cm Foreign bodies: no foreign bodies Tendon involvement: none Nerve involvement: none Vascular damage: no Anesthesia method: refused. Patient sedated: no Irrigation solution: saline Irrigation method: syringe Amount of cleaning: standard Debridement: none Degree of undermining: none Skin closure: staples Number of sutures: 3 Technique: simple Approximation: close Approximation difficulty: simple Patient tolerance: Patient tolerated the procedure well with no immediate complications.   (including critical care time)  Labs Reviewed - No data to display Ct Head Wo Contrast  07/03/2012  *RADIOLOGY REPORT*  Clinical Data:  Head injury  CT HEAD WITHOUT CONTRAST CT CERVICAL SPINE WITHOUT CONTRAST  Technique:  Multidetector CT imaging of the head and cervical spine was performed following the standard protocol without intravenous contrast.  Multiplanar CT image reconstructions of the cervical spine were also generated.  Comparison:  MRI head 09/09/2010  CT HEAD  Findings: Ventricle size is normal.  Mild atrophy for age.  No intracranial hemorrhage.  Negative for acute infarct or mass. Negative for skull fracture.  IMPRESSION: Mild atrophy for age.  No acute abnormality.  CT CERVICAL SPINE  Findings: Normal alignment.  Cervical kyphosis is present. Negative for fracture or mass.  Moderate spondylosis is present at   C3-4, C4-5, C5-6, and C6-7. There is also spondylosis on the right at C7-T1.  IMPRESSION: Cervical spondylosis.  Negative for fracture.   Original Report Authenticated By: Janeece Riggers, M.D.    Ct Cervical Spine  Wo Contrast  07/03/2012  *RADIOLOGY REPORT*  Clinical Data:  Head injury  CT HEAD WITHOUT CONTRAST CT CERVICAL SPINE WITHOUT CONTRAST  Technique:  Multidetector CT imaging of the head and cervical spine was performed following the standard protocol without intravenous contrast.  Multiplanar CT image reconstructions of the cervical spine were also generated.  Comparison:  MRI head 09/09/2010  CT HEAD  Findings: Ventricle size is normal.  Mild atrophy for age.  No intracranial hemorrhage.  Negative for acute infarct or mass. Negative for skull fracture.  IMPRESSION: Mild atrophy for age.  No acute abnormality.  CT CERVICAL SPINE  Findings: Normal alignment.  Cervical kyphosis is present. Negative for fracture or mass.  Moderate spondylosis is present at   C3-4, C4-5, C5-6, and C6-7. There is also spondylosis on the right at C7-T1.  IMPRESSION: Cervical spondylosis.  Negative for fracture.   Original Report Authenticated By: Janeece Riggers, M.D.      1. Laceration of head   2. Closed head injury       MDM   The patient is a 51 year old male who presents after a injury at church. A 2 x 4 fell on his head while he was building a stage proper for a Christmas plate. Patient had less than 10 second loss of consciousness. This happened about 4-5 hours ago. Here he is alert and oriented in complain of mild head and neck pain. Patient sustained a 4 cm laceration to his right parietal scalp. It is hemostatic. Patient has some diffuse neck muscular tenderness and mild bony tenderness in lower C-spine. Head CT and CT neck are obtained. His tetanus was updated here. Patient given narcotic pain medicine to help with his pain. CT of his head and neck are negative. These films were reviewed by me. Wound repair as above. Stable for discharge.        Elwin Mocha, MD 07/03/12 [phone]

## 2012-07-03 NOTE — ED Notes (Signed)
Pt sts 2 boards fell and hit right side of head and has laceration noted; pt sts brief LOC and c/o pain and HA at present; bleeding controlled

## 2012-07-04 NOTE — ED Provider Notes (Signed)
I saw and evaluated the patient, reviewed the resident's note and I agree with the findings and plan.  Toy Baker, MD 07/04/12 2220

## 2013-10-04 ENCOUNTER — Ambulatory Visit (INDEPENDENT_AMBULATORY_CARE_PROVIDER_SITE_OTHER): Payer: BC Managed Care – PPO | Admitting: Emergency Medicine

## 2013-10-04 VITALS — BP 144/110 | HR 83 | Temp 98.0°F | Resp 18 | Ht 67.5 in | Wt 205.6 lb

## 2013-10-04 DIAGNOSIS — L659 Nonscarring hair loss, unspecified: Secondary | ICD-10-CM

## 2013-10-04 NOTE — Patient Instructions (Signed)
Alopecia Areata  Alopecia areata is a self-destructing (autoimmune) disease that results in the loss of hair. In this condition your body's immune system attacks the hair follicle. The hair follicle is responsible for growing hair. Hair loss can occur on the scalp and other parts of the body. It usually starts as one or more small, round, smooth patches of hair loss. It occurs in males and females of all ages and races, but usually starts before age 51. The scalp is the most commonly affected area, but the beard or any hair-bearing site can be affected. This type of hair loss does not leave scars where the hair was lost.   Many people with alopecia areata only have a few patches of hair loss. In others, extensive patchy hair loss occurs. In a few people, all scalp hair is lost (alopecia totalis), or hair is lost from the entire scalp and body (alopecia universalis). No matter how widespread the hair loss, the hair follicles remain alive and are ready to resume normal hair production whenever they receive the correct signal. Hair re-growth may occur without treatment and can even restart after years of hair loss.   CAUSES   It is thought that something triggers the immune system to stop hair growth. It is not always known what the cause is. Some people have genetic markers that can increase the chance of developing alopecia areata. Alopecia areata often occurs in families whose members have had:  · Asthma.  · Hay fever.  · Atopic eczema.  · Some autoimmune diseases may also be a trigger, such as:  · Thyroid disease.  · Diabetes.  · Rheumatoid arthritis.  · Lupus erythematosus.  · Vitiligo.  · Pernicious anemia.  · Addison's disease.  OTHER SYMPTOMS  In some people, the nail beds may develop rows of tiny dents (stippling) or the nail beds can become distorted. Other than the hair and nail beds, no other body part is affected.   PROGNOSIS   Alopecia areata is not medically disabling. People with alopecia areata are  usually in excellent health. Hair loss can be emotionally difficult. The National Alopecia Areata Foundation has resources available to help individuals and families with alopecia areata. Their goal is to help people with the condition live full, productive lives. There are many successful, well-adjusted, contented people living with Alopecia areata. Alopecia areata can be overcome with:  · A positive self image.  · Sound medical facts.  · The support of others, such as:  · Sometimes professional counseling is helpful to develop one's self-confidence and positive self-image.  TREATMENT   There is no cure for alopecia areata. There are several available treatments. Treatments are most effective in milder cases. No treatment is effective for everyone. Choice of treatment depends mainly on a person's age and the extent of their hair loss.  Alopecia areata occurs in two forms:   · A mild patchy form where less than 50 percent of scalp hair is lost.  · An extensive form where greater than 50 percent of scalp hair is lost.  These two forms of alopecia areata behave quite differently, and the choice of treatment depends on which form is present. Current treatments do not turn alopecia areata off. They can stimulate the hair follicle to produce hair.   Some medications used to treat mild cases include:  · Cortisone injections. The most common treatment is the injection of cortisone into the bare skin patches. The injections are usually given by a caregiver specializing   in skin issues (dermatologist). This caregiver will use a tiny needle to give multiple injections into the skin in and around the bare patches. The injections are repeated once a month. If new hair growth occurs, it is usually visible within 4 weeks. Treatment does not prevent new patches of hair loss from developing. There are few side effects from local cortisone injections. Occasionally, temporary dents (depressions) in the skin result from the local  injections, but these dents can fill in by themselves.  · Topical minoxidil. Five percent topical minoxidil solution applied twice daily may grow hair in alopecia areata. Scalp, eyebrows, and beard hair may respond. If scalp hair re-grows completely, treatment can be stopped. Response may improve if topical cortisone cream is applied 30 minutes after the minoxidil. Topical minoxidil is safe, easy to use, and does not lower blood pressure in persons with normal blood pressure. Minoxidil can lead to unwanted facial hair growth in some people.  · Anthralin cream or ointment. Another treatment is the application of anthralin cream or ointment. Anthralin is a tar-like substance that has been used widely for psoriasis. Anthralin is applied to the bare patches once daily. It is washed off after a short time, usually 30 to 60 minutes later. If new hair growth occurs, it is seen in 8 to 12 weeks. Anthralin can be irritating to the skin. It can cause temporary, brownish discoloration of the treated skin. By using short treatment times, skin irritation and skin staining are reduced without decreasing effectiveness. Care must be taken not to get anthralin in the eyes.  Some of the medications used for more extensive cases where there is greater than 50% hair loss include:  · Cortisone pills. Cortisone pills are sometimes given for extensive scalp hair loss. Cortisone taken internally is much stronger than local injections of cortisone into the skin. It is necessary to discuss possible side effects of cortisone pills with your caregiver. In general, however, cortisone pills are used in relatively few patients with alopecia areata due to health risks from prolonged use. Also, hair that has grown is likely to fall out when the cortisone pills are stopped.  · Topical minoxidil. See previous explanation under mild, patchy alopecia areata. However, minoxidil is not effective in total loss of scalp hair (alopecia totalis).  · Topical  immunotherapy. Another method of treating alopecia areata or alopecia totalis/universalis involves producing an allergic rash or allergic contact dermatitis. Chemicals such as diphencyprone (DPCP) or squaric acid dibutyl ester (SADBE) are applied to the scalp to produce an allergic rash which resembles poison oak or ivy. Approximately 40% of patients treated with topical immunotherapy will re-grow scalp hair after about 6 months of treatment. Those who do successfully re-grow scalp hair will need to continue treatment to maintain hair re-growth.  · Wigs. For extensive hair loss, a wig can be an important option for some people. Proper attention will make a quality wig look completely natural. A wig will need to be cut, thinned, and styled. To keep a net base wig from falling off, special double-sided tape can be purchased in beauty supply outlets and fastened to the inside of the wig.  · For those with completely bare heads, there are suction caps to which any wig can be attached. There are also entire suction cap wig units.  FOR MORE INFORMATION  National Alopecia Areata Foundation: www.naaf.org  Document Released: 02/23/2004 Document Revised: 10/13/2011 Document Reviewed: 09/26/2008  [employer]® Patient Information ©2014 [employer].

## 2013-10-04 NOTE — Progress Notes (Signed)
[facility] [address], [place] [address] [phone]  Date:  10/04/2013   Name:  [name]   DOB:  1960/10/12   MRN:  [id number]  PCP:  Lajean SaverJONES, ANGIE L, NT    Chief Complaint: Alopecia   History of Present Illness:  [name] is a 53 y.o. very pleasant male patient who presents with the following:  Noticed two weeks ago that he has two enlarging patches on his head where he has lost hair.  No history of trauma  Denies other complaint or health concern today.   Patient Active Problem List   Diagnosis Date Noted  . Cough 06/22/2011    Past Medical History  Diagnosis Date  . Hemiplegic migraine   . TIA (transient ischemic attack)   . Hyperlipidemia   . Hypertension   . Allergic rhinitis     Past Surgical History  Procedure Laterality Date  . Sinus surgery with instatrak    . Lumbar spine surgery      History  Substance Use Topics  . Smoking status: Never Smoker   . Smokeless tobacco: Former NeurosurgeonUser    Types: Chew    Quit date: 08/04/2008  . Alcohol Use: No    History reviewed. No pertinent family history.  Allergies  Allergen Reactions  . Morphine And Related     "burning"    Medication list has been reviewed and updated.  Current Outpatient Prescriptions on File Prior to Visit  Medication Sig Dispense Refill  . aspirin 325 MG tablet Take 325 mg by mouth daily.      Marland Kitchen. BENICAR 20 MG tablet Take 1 tablet by mouth daily.      Marland Kitchen. latanoprost (XALATAN) 0.005 % ophthalmic solution Place 1 drop into both eyes at bedtime.        . pantoprazole (PROTONIX) 40 MG tablet Take 1 tablet (40 mg total) by mouth daily.  30 tablet  1   No current facility-administered medications on file prior to visit.    Review of Systems:  As per HPI, otherwise negative.    Physical Examination: Filed Vitals:   10/04/13 1640  BP: 144/110  Pulse: 83  Temp: 98 F (36.7 C)  Resp: 18   Filed Vitals:   10/04/13 1640  Height: 5'  7.5" (1.715 m)  Weight: 205 lb 9.6 oz (93.26 kg)   Body mass index is 31.71 kg/(m^2). Ideal Body Weight: Weight in (lb) to have BMI = 25: 161.7   GEN: WDWN, NAD, Non-toxic, Alert & Oriented x 3 HEENT: Atraumatic, Normocephalic.  Ears and Nose: No external deformity. EXTR: No clubbing/cyanosis/edema NEURO: Normal gait.  PSYCH: Normally interactive. Conversant. Not depressed or anxious appearing.  Calm demeanor.  Skin:  Alopecia on scalp  Assessment and Plan: Alopecia tsh Dermatology consult  Signed,  Phillips OdorJeffery Perl Folmar, MD

## 2013-10-05 LAB — TSH: TSH: 1.648 u[IU]/mL (ref 0.350–4.500)

## 2013-10-06 ENCOUNTER — Telehealth: Payer: Self-pay

## 2013-10-06 NOTE — Telephone Encounter (Signed)
Normal TSH

## 2013-10-06 NOTE — Telephone Encounter (Signed)
Patient calling about his lab results from 10/04/2013.   [phone]

## 2013-10-07 NOTE — Telephone Encounter (Signed)
Pt.notified

## 2013-11-21 ENCOUNTER — Ambulatory Visit (INDEPENDENT_AMBULATORY_CARE_PROVIDER_SITE_OTHER): Payer: BC Managed Care – PPO | Admitting: Internal Medicine

## 2013-11-21 VITALS — BP 160/110 | HR 72 | Temp 98.1°F | Resp 18 | Ht 67.5 in | Wt 201.4 lb

## 2013-11-21 DIAGNOSIS — I10 Essential (primary) hypertension: Secondary | ICD-10-CM

## 2013-11-21 DIAGNOSIS — K219 Gastro-esophageal reflux disease without esophagitis: Secondary | ICD-10-CM

## 2013-11-21 DIAGNOSIS — H60399 Other infective otitis externa, unspecified ear: Secondary | ICD-10-CM

## 2013-11-21 MED ORDER — NEOMYCIN-POLYMYXIN-HC 3.5-10000-1 OT SUSP: 3.0000 [drp] | Freq: Four times a day (QID) | OTIC | Status: DC

## 2013-11-21 NOTE — Progress Notes (Signed)
   Subjective:    Patient ID: [name], male    DOB: Jun 13, 1961, 53 y.o.   MRN: [id number] This chart was scribed for Ellamae Siaobert Tiarrah Saville, MD by Danella Maiersaroline Early, ED Scribe. This patient was seen in room 1 and the patient's care was started at 7:49 PM.  Chief Complaint  Patient presents with  . Otalgia    right ear since since yesterday-worse today. Had a bug fly into ear a few days prior. Hearing is okay, no drainage, no tinnitus    HPI HPI Comments: [name] is a 53 y.o. male who presents to [facility] complaining of Intermittent, sharp and stabbing right ear pain since yesterday after having a bug fly into the same ear a few days ago. He denies associated hearing loss, ear discharge, or tinnitus.  PCP - Lajean SaverJONES, ANGIE L, NT  Patient Active Problem List   Diagnosis Date Noted  . Unspecified essential hypertension 11/22/2013  . GERD (gastroesophageal reflux disease) 11/22/2013  . Cough 06/22/2011     Current Outpatient Prescriptions on File Prior to Visit  Medication Sig Dispense Refill  . aspirin 325 MG tablet Take 325 mg by mouth daily.      Marland Kitchen. BENICAR 20 MG tablet Take 1 tablet by mouth daily.      Marland Kitchen. latanoprost (XALATAN) 0.005 % ophthalmic solution Place 1 drop into both eyes at bedtime.        . pantoprazole (PROTONIX) 40 MG tablet Take 1 tablet (40 mg total) by mouth daily.  30 tablet  1   No current facility-administered medications on file prior to visit.     Review of Systems  HENT: Positive for ear pain. Negative for ear discharge, hearing loss and tinnitus.        Objective:   Physical Exam  Nursing note and vitals reviewed. Constitutional: He is oriented to person, place, and time. He appears well-developed and well-nourished. No distress.  HENT:  Head: Normocephalic and atraumatic.  Mild irritation to the right external auditory canal. Mild erythema.  Eyes: EOM are normal.  Neck: Neck supple.  Cardiovascular: Normal  rate.   Pulmonary/Chest: Effort normal. No respiratory distress.  Musculoskeletal: Normal range of motion.  Lymphadenopathy:    He has no cervical adenopathy.  Neurological: He is alert and oriented to person, place, and time.  Skin: Skin is warm and dry.  Psychiatric: He has a normal mood and affect. His behavior is normal.    Filed Vitals:   11/21/13 1913  BP: 160/110  Pulse: 72  Temp: 98.1 F (36.7 C)  TempSrc: Oral  Resp: 18  Height: 5' 7.5" (1.715 m)  Weight: 201 lb 6 oz (91.343 kg)  SpO2: 98%         Assessment & Plan:  Otitis, externa, infective Meds ordered this encounter  Medications  . neomycin-polymyxin-hydrocortisone (CORTISPORIN) 3.5-10000-1 otic suspension    Sig: Place 3 drops into the right ear 4 (four) times daily.    Dispense:  10 mL    Refill:  0    Unspecified essential hypertension-not well controlled today/follow up PCP for recheck after this ear condition he was treated     I have completed the patient encounter in its entirety as documented by the scribe, with editing by me where necessary. Chihiro Frey P. Merla Richesoolittle, M.D.

## 2013-11-22 DIAGNOSIS — I10 Essential (primary) hypertension: Secondary | ICD-10-CM | POA: Insufficient documentation

## 2013-11-22 DIAGNOSIS — K219 Gastro-esophageal reflux disease without esophagitis: Secondary | ICD-10-CM | POA: Insufficient documentation

## 2013-12-21 ENCOUNTER — Encounter: Payer: Self-pay | Admitting: Internal Medicine

## 2014-02-06 ENCOUNTER — Emergency Department (HOSPITAL_COMMUNITY): Payer: BC Managed Care – PPO

## 2014-02-06 ENCOUNTER — Observation Stay (HOSPITAL_COMMUNITY)
Admission: EM | Admit: 2014-02-06 | Discharge: 2014-02-07 | Disposition: A | Discharge status: Home / Self Care | Payer: BC Managed Care – PPO | Attending: Family Medicine | Admitting: Family Medicine

## 2014-02-06 ENCOUNTER — Encounter (HOSPITAL_COMMUNITY): Payer: Self-pay | Admitting: Emergency Medicine

## 2014-02-06 DIAGNOSIS — K219 Gastro-esophageal reflux disease without esophagitis: Secondary | ICD-10-CM | POA: Diagnosis present

## 2014-02-06 DIAGNOSIS — G4733 Obstructive sleep apnea (adult) (pediatric): Secondary | ICD-10-CM | POA: Insufficient documentation

## 2014-02-06 DIAGNOSIS — E785 Hyperlipidemia, unspecified: Secondary | ICD-10-CM | POA: Diagnosis present

## 2014-02-06 DIAGNOSIS — G43409 Hemiplegic migraine, not intractable, without status migrainosus: Secondary | ICD-10-CM | POA: Insufficient documentation

## 2014-02-06 DIAGNOSIS — I10 Essential (primary) hypertension: Secondary | ICD-10-CM | POA: Diagnosis present

## 2014-02-06 DIAGNOSIS — R0602 Shortness of breath: Secondary | ICD-10-CM | POA: Insufficient documentation

## 2014-02-06 DIAGNOSIS — G4730 Sleep apnea, unspecified: Secondary | ICD-10-CM | POA: Diagnosis present

## 2014-02-06 DIAGNOSIS — I2510 Atherosclerotic heart disease of native coronary artery without angina pectoris: Secondary | ICD-10-CM | POA: Insufficient documentation

## 2014-02-06 DIAGNOSIS — I200 Unstable angina: Secondary | ICD-10-CM

## 2014-02-06 DIAGNOSIS — Z87891 Personal history of nicotine dependence: Secondary | ICD-10-CM | POA: Insufficient documentation

## 2014-02-06 DIAGNOSIS — R079 Chest pain, unspecified: Secondary | ICD-10-CM

## 2014-02-06 DIAGNOSIS — Z8673 Personal history of transient ischemic attack (TIA), and cerebral infarction without residual deficits: Secondary | ICD-10-CM | POA: Insufficient documentation

## 2014-02-06 DIAGNOSIS — Z7982 Long term (current) use of aspirin: Secondary | ICD-10-CM | POA: Insufficient documentation

## 2014-02-06 DIAGNOSIS — I2584 Coronary atherosclerosis due to calcified coronary lesion: Secondary | ICD-10-CM | POA: Insufficient documentation

## 2014-02-06 DIAGNOSIS — I209 Angina pectoris, unspecified: Secondary | ICD-10-CM

## 2014-02-06 DIAGNOSIS — R072 Precordial pain: Principal | ICD-10-CM | POA: Insufficient documentation

## 2014-02-06 DIAGNOSIS — R55 Syncope and collapse: Secondary | ICD-10-CM

## 2014-02-06 HISTORY — DX: Pneumonia, unspecified organism: J18.9

## 2014-02-06 HISTORY — DX: Gastro-esophageal reflux disease without esophagitis: K21.9

## 2014-02-06 HISTORY — DX: Adverse effect of unspecified anesthetic, initial encounter: T41.45XA

## 2014-02-06 HISTORY — DX: Nonscarring hair loss, unspecified: L65.9

## 2014-02-06 HISTORY — DX: Other complications of anesthesia, initial encounter: T88.59XA

## 2014-02-06 HISTORY — DX: Legal blindness, as defined in USA: H54.8

## 2014-02-06 HISTORY — DX: Unspecified osteoarthritis, unspecified site: M19.90

## 2014-02-06 LAB — LIPID PANEL
CHOLESTEROL: 243 mg/dL — AB (ref 0–200)
HDL: 44 mg/dL (ref >39)
LDL CALC: 150 mg/dL — AB (ref 0–99)
Total CHOL/HDL Ratio: 5.5 RATIO
Triglycerides: 244 mg/dL — ABNORMAL HIGH (ref <150)
VLDL: 49 mg/dL — AB (ref 0–40)

## 2014-02-06 LAB — I-STAT TROPONIN, ED: Troponin i, poc: 0 ng/mL (ref 0.00–0.08)

## 2014-02-06 LAB — TROPONIN I: Troponin I: <0.3 ng/mL (ref <0.30)

## 2014-02-06 LAB — CBC
HEMATOCRIT: 49.2 % (ref 39.0–52.0)
Hemoglobin: 17.1 g/dL — ABNORMAL HIGH (ref 13.0–17.0)
MCH: 28.7 pg (ref 26.0–34.0)
MCHC: 34.8 g/dL (ref 30.0–36.0)
MCV: 82.6 fL (ref 78.0–100.0)
Platelets: 281 10*3/uL (ref 150–400)
RBC: 5.96 MIL/uL — ABNORMAL HIGH (ref 4.22–5.81)
RDW: 12.8 % (ref 11.5–15.5)
WBC: 9 10*3/uL (ref 4.0–10.5)

## 2014-02-06 LAB — BASIC METABOLIC PANEL
Anion gap: 13 (ref 5–15)
BUN: 12 mg/dL (ref 6–23)
CALCIUM: 10.1 mg/dL (ref 8.4–10.5)
CO2: 25 mEq/L (ref 19–32)
CREATININE: 0.86 mg/dL (ref 0.50–1.35)
Chloride: 101 mEq/L (ref 96–112)
Glucose, Bld: 95 mg/dL (ref 70–99)
POTASSIUM: 4.2 meq/L (ref 3.7–5.3)
Sodium: 139 mEq/L (ref 137–147)

## 2014-02-06 LAB — HEMOGLOBIN A1C
Hgb A1c MFr Bld: 5.5 % (ref <5.7)
Mean Plasma Glucose: 111 mg/dL (ref <117)

## 2014-02-06 LAB — TSH: TSH: 1.78 u[IU]/mL (ref 0.350–4.500)

## 2014-02-06 MED ORDER — NITROGLYCERIN 0.4 MG SL SUBL
0.4000 mg | SUBLINGUAL_TABLET | SUBLINGUAL | Status: DC | PRN
  Administered 2014-02-06 (×2): 0.4 mg via SUBLINGUAL

## 2014-02-06 MED ORDER — ENOXAPARIN SODIUM 40 MG/0.4ML ~~LOC~~ SOLN
40.0000 mg | SUBCUTANEOUS | Status: DC
  Administered 2014-02-06: 40 mg via SUBCUTANEOUS
  Filled 2014-02-06 (×2): qty 0.4

## 2014-02-06 MED ORDER — ACETAMINOPHEN 325 MG PO TABS: 650.0000 mg | ORAL_TABLET | ORAL | Status: DC | PRN

## 2014-02-06 MED ORDER — FENTANYL CITRATE 0.05 MG/ML IJ SOLN
50.0000 ug | Freq: Once | INTRAMUSCULAR | Status: AC
  Administered 2014-02-06: 50 ug via INTRAVENOUS
  Filled 2014-02-06: qty 2

## 2014-02-06 MED ORDER — IRBESARTAN 150 MG PO TABS
150.0000 mg | ORAL_TABLET | Freq: Every day | ORAL | Status: DC
  Filled 2014-02-06: qty 1

## 2014-02-06 MED ORDER — ASPIRIN 325 MG PO TABS
325.0000 mg | ORAL_TABLET | Freq: Every day | ORAL | Status: DC
  Filled 2014-02-06: qty 1

## 2014-02-06 MED ORDER — HYDROMORPHONE HCL PF 1 MG/ML IJ SOLN: 0.5000 mg | INTRAMUSCULAR | Status: DC | PRN

## 2014-02-06 MED ORDER — FAMOTIDINE 20 MG PO TABS
20.0000 mg | ORAL_TABLET | Freq: Two times a day (BID) | ORAL | Status: DC
  Administered 2014-02-06: 20 mg via ORAL
  Filled 2014-02-06 (×3): qty 1

## 2014-02-06 MED ORDER — ONDANSETRON HCL 4 MG/2ML IJ SOLN: 4.0000 mg | Freq: Four times a day (QID) | INTRAMUSCULAR | Status: DC | PRN

## 2014-02-06 MED ORDER — MONTELUKAST SODIUM 10 MG PO TABS
10.0000 mg | ORAL_TABLET | Freq: Every day | ORAL | Status: DC
  Administered 2014-02-06: 10 mg via ORAL
  Filled 2014-02-06 (×2): qty 1

## 2014-02-06 MED ORDER — LORATADINE 10 MG PO TABS
10.0000 mg | ORAL_TABLET | Freq: Every day | ORAL | Status: DC
  Filled 2014-02-06: qty 1

## 2014-02-06 MED ORDER — METOPROLOL TARTRATE 25 MG PO TABS
25.0000 mg | ORAL_TABLET | Freq: Two times a day (BID) | ORAL | Status: DC
  Administered 2014-02-06: 25 mg via ORAL
  Filled 2014-02-06 (×3): qty 1

## 2014-02-06 NOTE — H&P (Signed)
[facility] History and Physical Service Pager: [phone]  Patient name: [name] Medical record number: [id number] Date of birth: [birth year] Age: 53 y.o. Gender: male  Primary Care Provider: Lajean SaverJONES, ANGIE L, NT Consultants: Cardiology Code Status: Full  Chief Complaint: Chest pain  Assessment and Plan: [name] is a 53 y.o. male presenting with chest pain and shortness of breath. PMH is significant for HTN, HLD, OSA, hemiplegic migraine, and history of tobacco use.  Typical angina: Substernal, exertional, relieved by NTG. RFs: HTN, HLD, h/o tobacco use. HEART score: 5. Negative stress tests per history years ago; echo in 2011: EF 50-55%, grade 1 DD. Appears euvolemic. - Admit to FMTS on telemetry - Cardiology consulted - Repeat ECG in AM (NSR without ST segment changes in ED) - Cycle troponins (1st is neg) - Lipid panel, Hb A1c, TSH - Echocardiogram, given orthopnea - Dilaudid 0.5mg  q3h prn chest pain  - Nitroglycerin 0.4mg  SL q785min prn chest pain  - O2 by [place] prn hypoxemia - Continue ASA  HTN: Fairly controlled - Continue formulary ARB - Add beta blocker   Hyperlipidemia: Per history, though not on medications. History of myalgias with lipitor.  - Lipid panel, consider starting low-dose/reduced frequency dosing based on ASCVD risk.  OSA: CPAP improved sleep but was not continued to be covered by insurance.  - CPAP  FEN/GI: Saline lock IV, heart healthy diet Prophylaxis: Lovenox  Disposition: Admit to FMTS, attending Dr. Randolm IdolFletke.   History of Present Illness: [name] is a 53 y.o. male presenting with chest pain.   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 Upon returning to work he felt sudden pain just to the middle of the center of his  chest. It was severe, "crushing," and radiated up the left side of the neck and down the left arm. He remembers sweating and feeling nauseous but no emesis, palpitations, or syncope. The pain did not subside on rest, so he called EMS. Nitroglycerin was given and relieved the pain.   He had recently felt "just not well" this weekend while being outside during much of Saturday, and thereafter felt like he had the flu with muscle aches. His blood pressure at that time was elevated (he checks at home). He has had 2 other episodes of chest pain causing him to seek care and he reports two negative stress tests performed at those times (at least a couple years ago). He has not seen a cardiologist as an outpatient.   Of note, he denies lower extremity swelling but reports that he sleeps in a recliner because he feels like he is choking when he lays completely flat. Denies PND. No HA, vision changes, hearing changes, abd pain, diarrhea, constipation, urinary changes, joint aches. CPAP helped with dyspnea at night time, but insurance didn't cover it.   Review Of Systems: Per HPI Otherwise 12 point review of systems was performed and was unremarkable.  Patient Active Problem List   Diagnosis Date Noted  . Unspecified essential hypertension 11/22/2013  . GERD (gastroesophageal reflux disease) 11/22/2013  . Cough 06/22/2011   Past Medical History: Past Medical History  Diagnosis Date  . Hemiplegic migraine   . TIA (transient ischemic attack)   . Hyperlipidemia   . Hypertension   . Allergic rhinitis  Past Surgical History: Past Surgical History  Procedure Laterality Date  . Sinus surgery with instatrak    . Lumbar spine surgery     Social History: History  Substance Use Topics  . Smoking status: Never Smoker   . Smokeless tobacco: Former NeurosurgeonUser    Types: Chew    Quit date: 08/04/2008  . Alcohol Use: No   Additional social history: History of smoking, currently uses smokeless tobacco. No  EtOH. [place] and has good social support.   Please also refer to relevant sections of EMR.  Family History: No history of early cardiac disease  Allergies and Medications: Allergies  Allergen Reactions  . Morphine And Related     "burning"   No current facility-administered medications on file prior to encounter.   Current Outpatient Prescriptions on File Prior to Encounter  Medication Sig Dispense Refill  . aspirin 325 MG tablet Take 325 mg by mouth daily.        Objective: BP 102/75  Pulse 76  Temp(Src) 98 F (36.7 C) (Oral)  Resp 17  Ht 5\' 9"  (1.753 m)  Wt 200 lb (90.719 kg)  BMI 29.52 kg/m2  SpO2 97% Exam: General: Pleasant 53 y.o. male laying on stretcher in NAD HEENT: NCAT, sclerae normal, oropharynx clear with good dentition Cardiovascular: RRR, no murmur or gallop, no JVD or carotid bruits, 2+ radial and DP pulses.  Respiratory: Non-labored breathing ambient air, CTAB Abdomen: Normoactive bowel sounds, soft, NT, ND Extremities: Warm, dry, cap refill < 2 sec., no tenderness to palpation or swelling Skin: No rashes, wounds, or lesions Neuro: Alert and oriented, normal speech and gait, strength 5/5 bilateral upper and lower extremities.   Labs and Imaging: CBC BMET   Recent Labs Lab 02/06/14 1245  WBC 9.0  HGB 17.1*  HCT 49.2  PLT 281    Recent Labs Lab 02/06/14 1245  NA 139  K 4.2  CL 101  CO2 25  BUN 12  CREATININE 0.86  GLUCOSE 95  CALCIUM 10.1     CXR (02/06/2014) FINDINGS:  The heart size and mediastinal contours are within normal limits.  There is no evidence of pulmonary edema, consolidation,  pneumothorax, nodule or pleural fluid. The visualized skeletal  structures are unremarkable.  IMPRESSION:  No active disease.  ECG (02/06/2014) NSR, normal axis, normal intervals, no LVH or ischemic changes.   Hazeline Junkeryan Rector Devonshire, MD 02/06/2014, 3:31 PM PGY-2, [facility] FPTS Intern pager: [phone], text  pages welcome

## 2014-02-06 NOTE — ED Provider Notes (Signed)
I saw and evaluated the patient, reviewed the resident's note and I agree with the findings and plan.   EKG Interpretation   Date/Time:  Monday February 06 2014 12:35:20 EDT Ventricular Rate:  96 PR Interval:  135 QRS Duration: 88 QT Interval:  352 QTC Calculation: 445 R Axis:   45 Text Interpretation:  Sinus rhythm Probable left atrial enlargement  Baseline wander in lead(s) V3 Confirmed by WARD,  DO, KRISTEN [phone]([id number]) on  02/06/2014 12:41:57 PM      Pt is a 53 y.o. M with history of hypertension, hyperlipidemia, tobacco use who quit in 2010 who presents to the emergency department with left-sided chest pressure without radiation with associated shortness of breath and near-syncope. He states this happened while he was sleeping his carotids. Symptoms resolved with rest. He states he got back up to continue cleaning his garage when he began having chest pain again but did not go away. He was given 2 nitroglycerin in route with EMS which did improve his pain significantly. He is still not completely chest pain-free. He states he takes a 325 mg aspirin tablet daily and took one this morning. On exam, patient is well-appearing, nontoxic, hemodynamically stable. Heart and lung sounds normal. Concern for ACS in a patient with multiple risk factors. We'll obtain cardiac workup and admit. We'll give nitroglycerin to get patient chest pain-free. He states his last stress test was several years ago.  Layla MawKristen N Ward, DO 02/06/14 1254

## 2014-02-06 NOTE — Consult Note (Signed)
Primary Physician: Primary Cardiologist:   HPI:  [name] is a 53 y.o. male presenting with chest pain.  Patient has no history of CAD  This weekend didn't feel good  Nonspecific. 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  Felt like going to pass out  SOB  Subsided with rest.  Then developed. Upon returning to work he felt sudden pain just to the middle of the center of his chest. It was severe, "crushing," and radiated up the left side of the neck and down the left arm. He remembers sweating and feeling nauseous but no emesis, palpitations, or syncope. The pain did not subside on rest, so he called EMS. Nitroglycerin was given and relieved the pain.    He has had 2 other episodes of chest pain causing him to seek care and he reports two negative stress tests performed at those times ([place] [facility] [place] [facility] He has not seen a cardiologist as an outpatient.   Of note, he denies lower extremity swelling but reports that he sleeps in a recliner because he feels like he is choking when he lays completely flat. Denies PND. No HA, vision changes, hearing changes, abd pain, diarrhea, constipation, urinary changes, joint aches.  Last week felt ok   Did run out of BP meds a few wks ago.  Has been refilled with new med (another ARB) and has been on for 10 days.   MGF with cabg at 5469   Maternal cousin MI      Past Medical History  Diagnosis Date  . Hemiplegic migraine   . TIA (transient ischemic attack)   . Hyperlipidemia   . Hypertension   . Allergic rhinitis      (Not in a hospital admission)     Infusions:   Allergies  Allergen Reactions  . Morphine And Related     "burning"    History   Social History  . Marital Status: Married    Spouse Name: N/A    Number of Children: 2  . Years of Education: N/A   Occupational History  . [employer]    Social History Main Topics  . Smoking status: Never Smoker   . Smokeless tobacco:  Former NeurosurgeonUser    Types: Chew    Quit date: 08/04/2008  . Alcohol Use: No  . Drug Use: No  . Sexual Activity: Not on file   Other Topics Concern  . Not on file   Social History Narrative  . No narrative on file   FHX:  Maternal GF with CAD  CABG at 4069.  Maternal cousin with CAD in 1650s History reviewed. No pertinent family history.  REVIEW OF SYSTEMS:  All systems reviewed  Negative to the above problem except as noted above.    PHYSICAL EXAM: Filed Vitals:   02/06/14 1600  BP: 131/94  Pulse: 67  Temp:   Resp: 15    No intake or output data in the 24 hours ending 02/06/14 1612  General:  Well appearing. No respiratory difficulty HEENT: normal Neck: supple. no JVD. Carotids 2+ bilat; no bruits. No lymphadenopathy or thryomegaly appreciated. Cor: PMI nondisplaced. Regular rate & rhythm. No rubs, gallops or murmurs. Lungs: clear Chest:  Nontender Abdomen: soft, nontender, nondistended. No hepatosplenomegaly. No bruits or masses. Good bowel sounds. Extremities: no cyanosis, clubbing, rash, edema Neuro: alert & oriented x 3, cranial nerves grossly intact. moves all 4 extremities w/o difficulty. Affect pleasant.  ECG:  SR 96 bpm   Results for orders placed during the hospital encounter of 02/06/14 (from the past 24 hour(s))  CBC     Status: Abnormal   Collection Time    02/06/14 12:45 PM      Result Value Ref Range   WBC 9.0  4.0 - 10.5 K/uL   RBC 5.96 (*) 4.22 - 5.81 MIL/uL   Hemoglobin 17.1 (*) 13.0 - 17.0 g/dL   HCT 16.149.2  09.639.0 - 04.552.0 %   MCV 82.6  78.0 - 100.0 fL   MCH 28.7  26.0 - 34.0 pg   MCHC 34.8  30.0 - 36.0 g/dL   RDW 40.912.8  81.111.5 - 91.415.5 %   Platelets 281  150 - 400 K/uL  BASIC METABOLIC PANEL     Status: None   Collection Time    02/06/14 12:45 PM      Result Value Ref Range   Sodium 139  137 - 147 mEq/L   Potassium 4.2  3.7 - 5.3 mEq/L   Chloride 101  96 - 112 mEq/L   CO2 25  19 - 32 mEq/L   Glucose, Bld 95  70 - 99 mg/dL   BUN 12  6 - 23 mg/dL    Creatinine, Ser 7.820.86  0.50 - 1.35 mg/dL   Calcium 95.610.1  8.4 - 21.310.5 mg/dL   GFR calc non Af Amer >90  >90 mL/min   GFR calc Af Amer >90  >90 mL/min   Anion gap 13  5 - 15  I-STAT TROPOININ, ED     Status: None   Collection Time    02/06/14  1:03 PM      Result Value Ref Range   Troponin i, poc 0.00  0.00 - 0.08 ng/mL   Comment 3            Dg Chest 2 View  02/06/2014   CLINICAL DATA:  Chest pain.  EXAM: CHEST - 2 VIEW  COMPARISON:  09/09/2010  FINDINGS: The heart size and mediastinal contours are within normal limits. There is no evidence of pulmonary edema, consolidation, pneumothorax, nodule or pleural fluid. The visualized skeletal structures are unremarkable.  IMPRESSION: No active disease.   Electronically Signed   By: Irish LackGlenn  Yamagata M.D.   On: 02/06/2014 14:08     ASSESSMENT: patinet is a 53 yo who presents for evaluation of CP  Has not had this type before  Has had negative stress tests in past but for different symptoms. Currently pain free.  Does give some hx sugg of orthopnea.   On exam, volume status is good. EKG is normal  I am not convinced CP is cardiac though I cannot explain.  I would recom r/o for MI  If rules in then cath  If r/o presented cath vs myoview  Patient is reflecting.   Agree with echo  Get in AM.    Would check lipids.    Follow BP  Continue meds  Rx empirically for reflux.    [name]

## 2014-02-06 NOTE — ED Notes (Signed)
ED resident at bedside.

## 2014-02-06 NOTE — ED Notes (Signed)
EDP aware pain unrelieved by nitro.

## 2014-02-06 NOTE — ED Notes (Signed)
Pt reporting CP has returned. Nitro 0.4mg  given SL

## 2014-02-06 NOTE — H&P (Signed)
FMTS Attending Note  I personally saw and evaluated the patient. The plan of care was discussed with the resident team. I agree with the assessment and plan as documented by the resident.   53 y/o male with PMH HTN, OSA, Hemiplegic migraine, and history of tobacco use admitted with substernal chest pain, started during work today, felt like "heaviness", radiated to left arm and left neck, did have some associated sob, relieved with rest and Nitroglycerin. Please refer to resident note for additional HPI.  Vitals: reviewed Gen: pleasant male, NAD HEENT: normocephalic, PERRL, EOMI, no scleral icterus, MMM, neck supple Cardiac: RRR, S1 and S2, no murmurs, no heaves/thrills Resp: CTAB, normal effort Ext: no edema, no calf tenderness, 2+ DP pulses and Radial pulses bilaterally  Reviewed lab work, imaging, and EKG.  Assessment and Plan: 53 y/o male admitted with chest pain. 1. Chest Pain - initial cardiac workup negative, trend troponins and repeat EKG, check Echo for history of orthopnea, appreciate cardiology input 2. HTN - controlled on home ARB 3. HLD - check lipid profile, consider agent other than Lipitor given history of Myalgias 4. OSA - likely causing orthopnea like symptoms as not currently on CPAP  Donnella ShamKyle Clifton Kovacic MD

## 2014-02-06 NOTE — ED Notes (Signed)
Admitting MD at bedside.

## 2014-02-06 NOTE — ED Notes (Signed)
MD at bedside. 

## 2014-02-06 NOTE — ED Provider Notes (Signed)
CSN: [id number]     Arrival date & time 02/06/14  1231 History   First MD Initiated Contact with Patient 02/06/14 1238     Chief Complaint  Patient presents with  . Chest Pain     (Consider location/radiation/quality/duration/timing/severity/associated sxs/prior Treatment) Patient is a 53 y.o. male presenting with chest pain.  Chest Pain Pain location:  Substernal area Pain quality: pressure   Pain radiates to:  Neck Pain radiates to the back: no   Pain severity:  Moderate Chronicity:  New Relieved by:  Nitroglycerin and rest Associated symptoms: diaphoresis   Associated symptoms: no abdominal pain, no altered mental status, no back pain, no cough, no fever, no headache, no nausea, no shortness of breath and not vomiting   Risk factors: high cholesterol, hypertension and male sex   Risk factors: no diabetes mellitus and no smoking     Past Medical History  Diagnosis Date  . Hemiplegic migraine   . TIA (transient ischemic attack)   . Hyperlipidemia   . Hypertension   . Allergic rhinitis    Past Surgical History  Procedure Laterality Date  . Sinus surgery with instatrak    . Lumbar spine surgery     History reviewed. No pertinent family history. History  Substance Use Topics  . Smoking status: Never Smoker   . Smokeless tobacco: Former NeurosurgeonUser    Types: Chew    Quit date: 08/04/2008  . Alcohol Use: No    Review of Systems  Constitutional: Positive for diaphoresis. Negative for fever and chills.  HENT: Negative for sore throat.   Eyes: Negative for pain.  Respiratory: Negative for cough and shortness of breath.   Cardiovascular: Positive for chest pain.  Gastrointestinal: Negative for nausea, vomiting and abdominal pain.  Genitourinary: Negative for dysuria and flank pain.  Musculoskeletal: Negative for back pain and neck pain.  Skin: Negative for rash.  Neurological: Negative for seizures and headaches.      Allergies  Morphine and related  Home Medications    Prior to Admission medications   Medication Sig Start Date End Date Taking? Authorizing Provider  aspirin 325 MG tablet Take 325 mg by mouth daily.   Yes Historical Provider, MD  desloratadine (CLARINEX) 5 MG tablet Take 5 mg by mouth daily.   Yes Historical Provider, MD  famotidine (PEPCID) 20 MG tablet Take 20 mg by mouth 2 (two) times daily.   Yes Historical Provider, MD  montelukast (SINGULAIR) 10 MG tablet Take 10 mg by mouth at bedtime.   Yes Historical Provider, MD  valsartan (DIOVAN) 160 MG tablet Take 160 mg by mouth daily.  01/24/14  Yes Historical Provider, MD   BP 112/79  Pulse 66  Temp(Src) 98 F (36.7 C) (Oral)  Resp 14  Ht 5\' 9"  (1.753 m)  Wt 200 lb (90.719 kg)  BMI 29.52 kg/m2  SpO2 99% Physical Exam  Constitutional: He is oriented to person, place, and time. He appears well-developed and well-nourished. No distress.  HENT:  Head: Normocephalic and atraumatic.  Eyes: Pupils are equal, round, and reactive to light.  Neck: Normal range of motion.  Cardiovascular: Normal rate, regular rhythm, S1 normal, S2 normal and intact distal pulses.   Pulmonary/Chest: Effort normal and breath sounds normal.  Abdominal: Soft. He exhibits no distension. There is no tenderness.  Musculoskeletal: Normal range of motion.  Neurological: He is alert and oriented to person, place, and time.  Skin: Skin is warm. He is not diaphoretic.    ED Course  Procedures (including critical care time) Labs Review Labs Reviewed  CBC - Abnormal; Notable for the following:    RBC 5.96 (*)    Hemoglobin 17.1 (*)    All other components within normal limits  BASIC METABOLIC PANEL  Rosezena SensorI-STAT TROPOININ, ED    Imaging Review Dg Chest 2 View  02/06/2014   CLINICAL DATA:  Chest pain.  EXAM: CHEST - 2 VIEW  COMPARISON:  09/09/2010  FINDINGS: The heart size and mediastinal contours are within normal limits. There is no evidence of pulmonary edema, consolidation, pneumothorax, nodule or pleural fluid.  The visualized skeletal structures are unremarkable.  IMPRESSION: No active disease.   Electronically Signed   By: Irish LackGlenn  Yamagata M.D.   On: 02/06/2014 14:08     EKG Interpretation   Date/Time:  Monday February 06 2014 12:35:20 EDT Ventricular Rate:  96 PR Interval:  135 QRS Duration: 88 QT Interval:  352 QTC Calculation: 445 R Axis:   45 Text Interpretation:  Sinus rhythm Probable left atrial enlargement  Baseline wander in lead(s) V3 Confirmed by WARD,  DO, KRISTEN ([id number]) on  02/06/2014 12:41:57 PM      MDM   Final diagnoses:  Chest pain, unspecified chest pain type   53 year old male with a history of hypertension and hyperlipidemia presents today with acute onset chest pain. Patient has no history of prior MIs. He has no history of coronary artery disease.  Patient's pain was relieved partially by sublingual nitroglycerin pills. Patient's pain was relieved completely with additional fentanyl (patient is allergic to morphine). Upon reassessment the patient he is still hemodynamically stable in no acute distress. He is complaining of no chest pain. Given the patient's risk factors, though the patient does have a normal EKG and normal troponin, patient will need need to be admitted for further ACS rule out. I discussed this with the patient who is in agreement with this plan. Patient does not have an assigned treatment team in the hospital. A consultation with the admission team agreed to do the patient for continued ACS rule out. The patient was admitted to the floor in stable condition. Patient was seen and evaluated by myself and by the attending Dr. Reyes IvanWard      Day Greb, MD 02/06/14 1600

## 2014-02-06 NOTE — ED Notes (Signed)
Attempted report x1. 

## 2014-02-06 NOTE — ED Notes (Signed)
Pt presents via EMS from work. States that he was doing some exertional cleaning and experienced sudden onset central chest pain with radiation to the neck. Denies n/v, lightheadedness, LOC. EMS gave 2 nitro en route with some relief. Pt reports CP 3/10 after nitro. NAD noted.

## 2014-02-06 NOTE — ED Notes (Signed)
Pt reports no chest pain at this time.  

## 2014-02-07 ENCOUNTER — Encounter (HOSPITAL_COMMUNITY)
Admission: EM | Disposition: A | Discharge status: Home / Self Care | Payer: Self-pay | Source: Home / Self Care | Attending: Emergency Medicine

## 2014-02-07 DIAGNOSIS — I517 Cardiomegaly: Secondary | ICD-10-CM

## 2014-02-07 DIAGNOSIS — I10 Essential (primary) hypertension: Secondary | ICD-10-CM

## 2014-02-07 DIAGNOSIS — K219 Gastro-esophageal reflux disease without esophagitis: Secondary | ICD-10-CM

## 2014-02-07 DIAGNOSIS — G4730 Sleep apnea, unspecified: Secondary | ICD-10-CM | POA: Diagnosis present

## 2014-02-07 DIAGNOSIS — R079 Chest pain, unspecified: Secondary | ICD-10-CM

## 2014-02-07 DIAGNOSIS — G478 Other sleep disorders: Secondary | ICD-10-CM

## 2014-02-07 DIAGNOSIS — E785 Hyperlipidemia, unspecified: Secondary | ICD-10-CM

## 2014-02-07 HISTORY — PX: LEFT HEART CATHETERIZATION WITH CORONARY ANGIOGRAM: SHX5451

## 2014-02-07 LAB — LIPID PANEL
CHOL/HDL RATIO: 5.4 ratio
Cholesterol: 242 mg/dL — ABNORMAL HIGH (ref 0–200)
HDL: 45 mg/dL (ref >39)
LDL Cholesterol: 154 mg/dL — ABNORMAL HIGH (ref 0–99)
TRIGLYCERIDES: 215 mg/dL — AB (ref <150)
VLDL: 43 mg/dL — ABNORMAL HIGH (ref 0–40)

## 2014-02-07 LAB — TROPONIN I

## 2014-02-07 SURGERY — LEFT HEART CATHETERIZATION WITH CORONARY ANGIOGRAM
Anesthesia: LOCAL

## 2014-02-07 MED ORDER — METOPROLOL TARTRATE 25 MG PO TABS: 25.0000 mg | ORAL_TABLET | Freq: Two times a day (BID) | ORAL | Status: DC

## 2014-02-07 MED ORDER — VERAPAMIL HCL 2.5 MG/ML IV SOLN
INTRAVENOUS | Status: AC
  Filled 2014-02-07: qty 2

## 2014-02-07 MED ORDER — ASPIRIN 81 MG PO CHEW: 81.0000 mg | CHEWABLE_TABLET | Freq: Every day | ORAL | Status: DC

## 2014-02-07 MED ORDER — ASPIRIN EC 81 MG PO TBEC: 81.0000 mg | DELAYED_RELEASE_TABLET | Freq: Every day | ORAL | Status: DC

## 2014-02-07 MED ORDER — FENTANYL CITRATE 0.05 MG/ML IJ SOLN
INTRAMUSCULAR | Status: AC
  Filled 2014-02-07: qty 2

## 2014-02-07 MED ORDER — NITROGLYCERIN 0.2 MG/ML ON CALL CATH LAB
INTRAVENOUS | Status: AC
  Filled 2014-02-07: qty 1

## 2014-02-07 MED ORDER — SODIUM CHLORIDE 0.9 % IV SOLN
INTRAVENOUS | Status: AC
  Administered 2014-02-07: 16:00:00 via INTRAVENOUS

## 2014-02-07 MED ORDER — GI COCKTAIL ~~LOC~~
30.0000 mL | Freq: Once | ORAL | Status: DC
  Filled 2014-02-07: qty 30

## 2014-02-07 MED ORDER — MIDAZOLAM HCL 2 MG/2ML IJ SOLN
INTRAMUSCULAR | Status: AC
  Filled 2014-02-07: qty 2

## 2014-02-07 MED ORDER — LIDOCAINE HCL (PF) 1 % IJ SOLN
INTRAMUSCULAR | Status: AC
  Filled 2014-02-07: qty 30

## 2014-02-07 MED ORDER — ATORVASTATIN CALCIUM 20 MG PO TABS
20.0000 mg | ORAL_TABLET | Freq: Every day | ORAL | Status: DC
  Filled 2014-02-07: qty 1

## 2014-02-07 MED ORDER — HEPARIN SODIUM (PORCINE) 1000 UNIT/ML IJ SOLN
INTRAMUSCULAR | Status: AC
  Filled 2014-02-07: qty 1

## 2014-02-07 MED ORDER — HEPARIN (PORCINE) IN NACL 2-0.9 UNIT/ML-% IJ SOLN
INTRAMUSCULAR | Status: AC
  Filled 2014-02-07: qty 1000

## 2014-02-07 MED ORDER — ATORVASTATIN CALCIUM 20 MG PO TABS: 20.0000 mg | ORAL_TABLET | Freq: Every day | ORAL | Status: DC

## 2014-02-07 NOTE — Progress Notes (Signed)
FMTS Attending Note  I personally saw and evaluated the patient. The plan of care was discussed with the resident team. I agree with the assessment and plan as documented by the resident.   Chest pain has resolved. EKG and Troponins negative. Cardiology planning to take patient for cardiac catheterization for further evaluation of chest pain. Also awaiting echocardiogram given history of orthopnea.   Donnella ShamKyle Corbet Hanley MD

## 2014-02-07 NOTE — Interval H&P Note (Signed)
Cath Lab Visit (complete for each Cath Lab visit)  Clinical Evaluation Leading to the Procedure:   ACS: No.  Non-ACS:    Anginal Classification: CCS III  Anti-ischemic medical therapy: Minimal Therapy (1 class of medications)  Non-Invasive Test Results: No non-invasive testing performed  Prior CABG: No previous CABG      History and Physical Interval Note:  02/07/2014 12:23 PM  [name]  has presented today for surgery, with the diagnosis of cp  The various methods of treatment have been discussed with the patient and family. After consideration of risks, benefits and other options for treatment, the patient has consented to  Procedure(s): LEFT HEART CATHETERIZATION WITH CORONARY ANGIOGRAM (N/A) as a surgical intervention .  The patient's history has been reviewed, patient examined, no change in status, stable for surgery.  I have reviewed the patient's chart and labs.  Questions were answered to the patient's satisfaction.     Lesleigh NoeSMITH III,Dewell Monnier W

## 2014-02-07 NOTE — Progress Notes (Signed)
Discharge education completed by RN. Pt and spouse received a copy of discharge paperwork and confirm understanding of follow up appointments and discharge medications. Both deny any questions at this time. IV removed, site is within normal limits. Pt will discharge from the unit via wheelchair. 

## 2014-02-07 NOTE — Progress Notes (Signed)
    Subjective:  No chest painovernight  Objective:  Vital Signs in the last 24 hours: Temp:  [97.6 F (36.4 C)-98.1 F (36.7 C)] 97.6 F (36.4 C) (07/07 0735) Pulse Rate:  [54-99] 57 (07/07 0735) Resp:  [10-20] 19 (07/07 0735) BP: (102-138)/(70-94) 128/76 mmHg (07/07 0735) SpO2:  [96 %-100 %] 99 % (07/07 0735) Weight:  [200 lb (90.719 kg)-200 lb 11.2 oz (91.037 kg)] 200 lb 11.2 oz (91.037 kg) (07/07 0400)  Intake/Output from previous day: No intake or output data in the 24 hours ending 02/07/14 0959  Physical Exam: General appearance: alert, cooperative and no distress Lungs: clear to auscultation bilaterally Heart: regular rate and rhythm   Rate: 57  Rhythm: normal sinus rhythm and sinus bradycardia  Lab Results:  Recent Labs  02/06/14 1245  WBC 9.0  HGB 17.1*  PLT 281    Recent Labs  02/06/14 1245  NA 139  K 4.2  CL 101  CO2 25  GLUCOSE 95  BUN 12  CREATININE 0.86    Recent Labs  02/06/14 2210 02/07/14 0750  TROPONINI <0.30 <0.30   No results found for this basename: INR,  in the last 72 hours  Imaging: Imaging results have been reviewed  Cardiac Studies:  Assessment/Plan:  52 y.o. male presented with chest pain "crushing pain" associated with weakness, Lt arm pain while sweeping.     Principal Problem:   Typical angina Active Problems:   Unspecified essential hypertension   GERD (gastroesophageal reflux disease)   Dyslipidemia   Sleep apnea- not compliant (insurance wouldn't pay)    PLAN:  Worrisome symptoms in this 52 y/o male with risk factors. May be best to proceed to cath- will review with MD.   Luke Kilroy PA-C Beeper [phone] 02/07/2014, 9:59 AM   I have seen and examined the patient along with Luke Kilroy PA-C.  I have reviewed the chart, notes and new data.  I agree with PA's note.  Key new complaints: asymptomatic at rest Key examination changes: normal exam Key new findings / data: low risk ECG and  enzymes  PLAN: Discussed invasive versus noninvasive evaluation for what appears to be new onset / unstable angina. He prefers the definitve evaluation with coronary angio. This procedure has been fully reviewed with the patient and written informed consent has been obtained.   November Sypher, MD, FACC [facility] [phone] 02/07/2014, 10:25 AM  

## 2014-02-07 NOTE — Progress Notes (Signed)
Utilization Review Completed.Jacci Ruberg T7/02/2014  

## 2014-02-07 NOTE — Progress Notes (Signed)
  Echocardiogram 2D Echocardiogram has been performed.  Saleem Coccia FRANCES 02/07/2014, 12:17 PM

## 2014-02-07 NOTE — Progress Notes (Signed)
Family Medicine Teaching Service Daily Progress Note Intern Pager: [phone]  Patient name: [name] Medical record number: [id number] Date of birth: Apr 18, 1961 Age: 53 y.o. Gender: male  Primary Care Provider: Remus LofflerJones, Angel S, PA-C Consultants: Cardiology Code Status: FULL  Pt Overview and Major Events to Date:  7/6: Pt presented with unstable angina. Notes he's had this issue in the past. EKG non-concerning. Cardiology consulted 7/7: Patient/cardiology felt definitive evaluation with coronary angiogram was the best option.  Assessment and Plan:  [name] is a 53 y.o. male presenting with chest pain and shortness of breath. PMH is significant for HTN, HLD, OSA, hemiplegic migraine, and history of smokeless tobacco use.   Typical angina: Substernal and exertional in nature, relieved by NTG. RFs: HTN, HLD, h/o smokeless tobacco use. HEART score: 5. Negative stress tests per history years ago; echo in 2011: EF 50-55%, grade 1 diastolic dysfunction.  10 year ASCVD risk 6.0% Appears euvolemic. Normal CXR - Currently on telemetry: HR in high 40s-50s. NSR and sinus brady with occasional PVCs - Cardiology consulted  - Repeat ECG unchanged  - Troponins neg x 3 - Cholesterol, LDL, and triglycerides elevated  - TSH wnl - Hb A1c 5.5 - Echocardiogram today, given orthopnea  - Dilaudid 0.5mg  q3h prn chest pain  - Nitroglycerin 0.4mg  SL q445min prn chest pain  - O2 by [place] prn hypoxemia- hasn't needed  - ASA 81mg . - Cardiology to perform coronary angiogram today  HTN: Fairly controlled  - Continue formulary ARB  - Added metoprolol 25mg   Hyperlipidemia: Per history, although not on medications. History of myalgias with lipitor.  - Given ASCVD risk, will start a moderate intensity statin.  - Given history of myalgias with Lipitor 40mg , will start at Lipitor 20mg  and monitor for symptoms  - Will consider a high intensity statin pending coronary angiogram results.  OSA: CPAP  improved sleep but was not continued to be covered by insurance.  - CPAP   FEN/GI: Saline lock IV, heart healthy diet  Prophylaxis: Lovenox   Disposition: Pending coronary angiogram results   Subjective:  Patient doing well today. Denies chest pain or SOB currently. He is very anxious about these symptoms and notes a cousin who had heard disease.   Objective: Temp:  [97.7 F (36.5 C)-98.1 F (36.7 C)] 98.1 F (36.7 C) (07/07 0400) Pulse Rate:  [54-99] 54 (07/07 0400) Resp:  [10-20] 18 (07/07 0400) BP: (102-138)/(70-94) 130/83 mmHg (07/07 0400) SpO2:  [96 %-100 %] 97 % (07/07 0400) Weight:  [200 lb (90.719 kg)-200 lb 11.2 oz (91.037 kg)] 200 lb 11.2 oz (91.037 kg) (07/07 0400) Physical Exam: General: Pleasant 53 y.o. male laying in bed in NAD  HEENT: NCAT, sclerae normal, oropharynx clear with good dentition  Cardiovascular: RRR, no murmur or gallop, no JVD or carotid bruits, 2+ radial and DP pulses.  Respiratory: Non-labored breathing on RA, CTAB. No wheezing, rhonchi, or crackles noted. Abdomen: Normoactive bowel sounds, soft, NDNT Extremities: Warm, dry, cap refill < 2 sec., no tenderness to palpation or swelling  Skin: No rashes, wounds, or lesions  Neuro: Alert and oriented, normal speech.  Laboratory:  Recent Labs Lab 02/06/14 1245  WBC 9.0  HGB 17.1*  HCT 49.2  PLT 281    Recent Labs Lab 02/06/14 1245  NA 139  K 4.2  CL 101  CO2 25  BUN 12  CREATININE 0.86  CALCIUM 10.1  GLUCOSE 95   Risk Stratification Labs  TSH    Component Value Date/Time  TSH 1.780 02/06/2014 1826   Hemoglobin A1C    Component Value Date/Time   HGBA1C 5.5 02/06/2014 1826   Lipid Panel     Component Value Date/Time   CHOL 242* 02/07/2014 0750   TRIG 215* 02/07/2014 0750   HDL 45 02/07/2014 0750   CHOLHDL 5.4 02/07/2014 0750   VLDL 43* 02/07/2014 0750   LDLCALC 154* 02/07/2014 0750     Imaging/Diagnostic Tests: Dg Chest 2 View  02/06/2014   CLINICAL DATA:  Chest pain.  EXAM:  CHEST - 2 VIEW  COMPARISON:  09/09/2010  FINDINGS: The heart size and mediastinal contours are within normal limits. There is no evidence of pulmonary edema, consolidation, pneumothorax, nodule or pleural fluid. The visualized skeletal structures are unremarkable.  IMPRESSION: No active disease.   Electronically Signed   By: Irish LackGlenn  Yamagata M.D.   On: 02/06/2014 14:08    Joanna Puffrystal S Dorsey, MD 02/07/2014, 6:22 AM PGY-1, [facility] FPTS Intern pager: [phone], text pages welcome

## 2014-02-07 NOTE — H&P (View-Only) (Signed)
    Subjective:  No chest painovernight  Objective:  Vital Signs in the last 24 hours: Temp:  [97.6 F (36.4 C)-98.1 F (36.7 C)] 97.6 F (36.4 C) (07/07 0735) Pulse Rate:  [54-99] 57 (07/07 0735) Resp:  [10-20] 19 (07/07 0735) BP: (102-138)/(70-94) 128/76 mmHg (07/07 0735) SpO2:  [96 %-100 %] 99 % (07/07 0735) Weight:  [200 lb (90.719 kg)-200 lb 11.2 oz (91.037 kg)] 200 lb 11.2 oz (91.037 kg) (07/07 0400)  Intake/Output from previous day: No intake or output data in the 24 hours ending 02/07/14 0959  Physical Exam: General appearance: alert, cooperative and no distress Lungs: clear to auscultation bilaterally Heart: regular rate and rhythm   Rate: 57  Rhythm: normal sinus rhythm and sinus bradycardia  Lab Results:  Recent Labs  02/06/14 1245  WBC 9.0  HGB 17.1*  PLT 281    Recent Labs  02/06/14 1245  NA 139  K 4.2  CL 101  CO2 25  GLUCOSE 95  BUN 12  CREATININE 0.86    Recent Labs  02/06/14 2210 02/07/14 0750  TROPONINI <0.30 <0.30   No results found for this basename: INR,  in the last 72 hours  Imaging: Imaging results have been reviewed  Cardiac Studies:  Assessment/Plan:  53 y.o. male presented with chest pain "crushing pain" associated with weakness, Lt arm pain while sweeping.     Principal Problem:   Typical angina Active Problems:   Unspecified essential hypertension   GERD (gastroesophageal reflux disease)   Dyslipidemia   Sleep apnea- not compliant (insurance wouldn't pay)    PLAN:  Worrisome symptoms in this 53 y/o male with risk factors. May be best to proceed to cath- will review with MD.   Corine ShelterLuke Kilroy PA-C [facility] [phone] 02/07/2014, 9:59 AM   I have seen and examined the patient along with Corine ShelterLuke Kilroy PA-C.  I have reviewed the chart, notes and new data.  I agree with PA's note.  Key new complaints: asymptomatic at rest Key examination changes: normal exam Key new findings / data: low risk ECG and  enzymes  PLAN: Discussed invasive versus noninvasive evaluation for what appears to be new onset / unstable angina. He prefers the definitve evaluation with coronary angio. This procedure has been fully reviewed with the patient and written informed consent has been obtained.   Thurmon FairMihai Kerith Sherley, MD, [facility]ACC [facility] [phone] 02/07/2014, 10:25 AM

## 2014-02-07 NOTE — Discharge Instructions (Signed)
Radial Site Care Refer to this sheet in the next few weeks. These instructions provide you with information on caring for yourself after your procedure. Your caregiver may also give you more specific instructions. Your treatment has been planned according to current medical practices, but problems sometimes occur. Call your caregiver if you have any problems or questions after your procedure. HOME CARE INSTRUCTIONS  You may shower the day after the procedure.Remove the bandage (dressing) and gently wash the site with plain soap and water.Gently pat the site dry.  Do not apply powder or lotion to the site.  Do not submerge the affected site in water for 3 to 5 days.  Inspect the site at least twice daily.  Do not flex or bend the affected arm for 24 hours.  No lifting over 5 pounds (2.3 kg) for 5 days after your procedure.  Do not drive home if you are discharged the same day of the procedure. Have someone else drive you.  You may drive 24 hours after the procedure unless otherwise instructed by your caregiver.  Do not operate machinery or power tools for 24 hours.  A responsible adult should be with you for the first 24 hours after you arrive home. What to expect:  Any bruising will usually fade within 1 to 2 weeks.  Blood that collects in the tissue (hematoma) may be painful to the touch. It should usually decrease in size and tenderness within 1 to 2 weeks. SEEK IMMEDIATE MEDICAL CARE IF:  You have unusual pain at the radial site.  You have redness, warmth, swelling, or pain at the radial site.  You have drainage (other than a small amount of blood on the dressing).  You have chills.  You have a fever or persistent symptoms for more than 72 hours.  You have a fever and your symptoms suddenly get worse.  Your arm becomes pale, cool, tingly, or numb.  You have heavy bleeding from the site. Hold pressure on the site. Document Released: 08/23/2010 Document Revised:  10/13/2011 Document Reviewed: 08/23/2010 [facility] Patient Information 2015 [place]. This information is not intended to replace advice given to you by your health care provider. Make sure you discuss any questions you have with your health care provider.  Smoking Cessation Quitting smoking is important to your health and has many advantages. However, it is not always easy to quit since nicotine is a very addictive drug. Often times, people try 3 times or more before being able to quit. This document explains the best ways for you to prepare to quit smoking. Quitting takes hard work and a lot of effort, but you can do it. ADVANTAGES OF QUITTING SMOKING  You will live longer, feel better, and live better.  Your body will feel the impact of quitting smoking almost immediately.  Within 20 minutes, blood pressure decreases. Your pulse returns to its normal level.  After 8 hours, carbon monoxide levels in the blood return to normal. Your oxygen level increases.  After 24 hours, the chance of having a heart attack starts to decrease. Your breath, hair, and body stop smelling like smoke.  After 48 hours, damaged nerve endings begin to recover. Your sense of taste and smell improve.  After 72 hours, the body is virtually free of nicotine. Your bronchial tubes relax and breathing becomes easier.  After 2 to 12 weeks, lungs can hold more air. Exercise becomes easier and circulation improves.  The risk of having a heart attack, stroke, cancer, or  lung disease is greatly reduced.  After 1 year, the risk of coronary heart disease is cut in half.  After 5 years, the risk of stroke falls to the same as a nonsmoker.  After 10 years, the risk of lung cancer is cut in half and the risk of other cancers decreases significantly.  After 15 years, the risk of coronary heart disease drops, usually to the level of a nonsmoker.  If you are pregnant, quitting smoking will improve your chances of having  a healthy baby.  The people you live with, especially any children, will be healthier.  You will have extra money to spend on things other than cigarettes. QUESTIONS TO THINK ABOUT BEFORE ATTEMPTING TO QUIT You may want to talk about your answers with your caregiver.  Why do you want to quit?  If you tried to quit in the past, what helped and what did not?  What will be the most difficult situations for you after you quit? How will you plan to handle them?  Who can help you through the tough times? Your family? Friends? A caregiver?  What pleasures do you get from smoking? What ways can you still get pleasure if you quit? Here are some questions to ask your caregiver:  How can you help me to be successful at quitting?  What medicine do you think would be best for me and how should I take it?  What should I do if I need more help?  What is smoking withdrawal like? How can I get information on withdrawal? GET READY  Set a quit date.  Change your environment by getting rid of all cigarettes, ashtrays, matches, and lighters in your home, car, or work. Do not let people smoke in your home.  Review your past attempts to quit. Think about what worked and what did not. GET SUPPORT AND ENCOURAGEMENT You have a better chance of being successful if you have help. You can get support in many ways.  Tell your family, friends, and co-workers that you are going to quit and need their support. Ask them not to smoke around you.  Get individual, group, or telephone counseling and support. Programs are available at [facility]. Call your local health department for information about programs in your area.  Spiritual beliefs and practices may help some smokers quit.  Download a "quit meter" on your computer to keep track of quit statistics, such as how long you have gone without smoking, cigarettes not smoked, and money saved.  Get a self-help book about quitting  smoking and staying off of tobacco. LEARN NEW SKILLS AND BEHAVIORS  Distract yourself from urges to smoke. Talk to someone, go for a walk, or occupy your time with a task.  Change your normal routine. Take a different route to work. Drink tea instead of coffee. Eat breakfast in a different place.  Reduce your stress. Take a hot bath, exercise, or read a book.  Plan something enjoyable to do every day. Reward yourself for not smoking.  Explore interactive web-based programs that specialize in helping you quit. GET MEDICINE AND USE IT CORRECTLY Medicines can help you stop smoking and decrease the urge to smoke. Combining medicine with the above behavioral methods and support can greatly increase your chances of successfully quitting smoking.  Nicotine replacement therapy helps deliver nicotine to your body without the negative effects and risks of smoking. Nicotine replacement therapy includes nicotine gum, lozenges, inhalers, nasal sprays, and skin patches. Some  may be available over-the-counter and others require a prescription.  Antidepressant medicine helps people abstain from smoking, but how this works is unknown. This medicine is available by prescription.  Nicotinic receptor partial agonist medicine simulates the effect of nicotine in your brain. This medicine is available by prescription. Ask your caregiver for advice about which medicines to use and how to use them based on your health history. Your caregiver will tell you what side effects to look out for if you choose to be on a medicine or therapy. Carefully read the information on the package. Do not use any other product containing nicotine while using a nicotine replacement product.  RELAPSE OR DIFFICULT SITUATIONS Most relapses occur within the first 3 months after quitting. Do not be discouraged if you start smoking again. Remember, most people try several times before finally quitting. You may have symptoms of withdrawal  because your body is used to nicotine. You may crave cigarettes, be irritable, feel very hungry, cough often, get headaches, or have difficulty concentrating. The withdrawal symptoms are only temporary. They are strongest when you first quit, but they will go away within 10-14 days. To reduce the chances of relapse, try to:  Avoid drinking alcohol. Drinking lowers your chances of successfully quitting.  Reduce the amount of caffeine you consume. Once you quit smoking, the amount of caffeine in your body increases and can give you symptoms, such as a rapid heartbeat, sweating, and anxiety.  Avoid smokers because they can make you want to smoke.  Do not let weight gain distract you. Many smokers will gain weight when they quit, usually less than 10 pounds. Eat a healthy diet and stay active. You can always lose the weight gained after you quit.  Find ways to improve your mood other than smoking. FOR MORE INFORMATION  www.smokefree.gov  Document Released: 07/15/2001 Document Revised: 01/20/2012 Document Reviewed: 10/30/2011 [facility] Patient Information 2015 [place], [place]. This information is not intended to replace advice given to you by your health care provider. Make sure you discuss any questions you have with your health care provider. Smoking Cessation, Tips for Success If you are ready to quit smoking, congratulations! You have chosen to help yourself be healthier. Cigarettes bring nicotine, tar, carbon monoxide, and other irritants into your body. Your lungs, heart, and blood vessels will be able to work better without these poisons. There are many different ways to quit smoking. Nicotine gum, nicotine patches, a nicotine inhaler, or nicotine nasal spray can help with physical craving. Hypnosis, support groups, and medicines help break the habit of smoking. WHAT THINGS CAN I DO TO MAKE QUITTING EASIER?  Here are some tips to help you quit for good:  Pick a date when you will quit smoking  completely. Tell all of your friends and family about your plan to quit on that date.  Do not try to slowly cut down on the number of cigarettes you are smoking. Pick a quit date and quit smoking completely starting on that day.  Throw away all cigarettes.   Clean and remove all ashtrays from your home, work, and car.   On a card, write down your reasons for quitting. Carry the card with you and read it when you get the urge to smoke.   Cleanse your body of nicotine. Drink enough water and fluids to keep your urine clear or pale yellow. Do this after quitting to flush the nicotine from your body.   Learn to predict your moods. Do not let  a bad situation be your excuse to have a cigarette. Some situations in your life might tempt you into wanting a cigarette.   Never have "just one" cigarette. It leads to wanting another and another. Remind yourself of your decision to quit.   Change habits associated with smoking. If you smoked while driving or when feeling stressed, try other activities to replace smoking. Stand up when drinking your coffee. Brush your teeth after eating. Sit in a different chair when you read the paper. Avoid alcohol while trying to quit, and try to drink fewer caffeinated beverages. Alcohol and caffeine may urge you to smoke.   Avoid foods and drinks that can trigger a desire to smoke, such as sugary or spicy foods and alcohol.   Ask people who smoke not to smoke around you.   Have something planned to do right after eating or having a cup of coffee. For example, plan to take a walk or exercise.   Try a relaxation exercise to calm you down and decrease your stress. Remember, you may be tense and nervous for the first 2 weeks after you quit, but this will pass.   Find new activities to keep your hands busy. Play with a pen, coin, or rubber band. Doodle or draw things on paper.   Brush your teeth right after eating. This will help cut down on the craving for  the taste of tobacco after meals. You can also try mouthwash.   Use oral substitutes in place of cigarettes. Try using lemon drops, carrots, cinnamon sticks, or chewing gum. Keep them handy so they are available when you have the urge to smoke.   When you have the urge to smoke, try deep breathing.   Designate your home as a nonsmoking area.   If you are a heavy smoker, ask your health care provider about a prescription for nicotine chewing gum. It can ease your withdrawal from nicotine.   Reward yourself. Set aside the cigarette money you save and buy yourself something nice.   Look for support from others. Join a support group or smoking cessation program. Ask someone at home or at work to help you with your plan to quit smoking.   Always ask yourself, "Do I need this cigarette or is this just a reflex?" Tell yourself, "Today, I choose not to smoke," or "I do not want to smoke." You are reminding yourself of your decision to quit.  Do not replace cigarette smoking with electronic cigarettes (commonly called e-cigarettes). The safety of e-cigarettes is unknown, and some may contain harmful chemicals.  If you relapse, do not give up! Plan ahead and think about what you will do the next time you get the urge to smoke.  HOW WILL I FEEL WHEN I QUIT SMOKING? You may have symptoms of withdrawal because your body is used to nicotine (the addictive substance in cigarettes). You may crave cigarettes, be irritable, feel very hungry, cough often, get headaches, or have difficulty concentrating. The withdrawal symptoms are only temporary. They are strongest when you first quit but will go away within 10-14 days. When withdrawal symptoms occur, stay in control. Think about your reasons for quitting. Remind yourself that these are signs that your body is healing and getting used to being without cigarettes. Remember that withdrawal symptoms are easier to treat than the major diseases that smoking can  cause.  Even after the withdrawal is over, expect periodic urges to smoke. However, these cravings are generally short lived and  will go away whether you smoke or not. Do not smoke!  WHAT RESOURCES ARE AVAILABLE TO HELP ME QUIT SMOKING? Your health care provider can direct you to community resources or hospitals for support, which may include:  Group support.  Education.  Hypnosis.  Therapy. Document Released: 04/18/2004 Document Revised: 05/11/2013 Document Reviewed: 01/06/2013 [facility] Patient Information 2015 [place], [place]. This information is not intended to replace advice given to you by your health care provider. Make sure you discuss any questions you have with your health care provider.  You Can Quit Smoking If you are ready to quit smoking or are thinking about it, congratulations! You have chosen to help yourself be healthier and live longer! There are lots of different ways to quit smoking. Nicotine gum, nicotine patches, a nicotine inhaler, or nicotine nasal spray can help with physical craving. Hypnosis, support groups, and medicines help break the habit of smoking. TIPS TO GET OFF AND STAY OFF CIGARETTES  Learn to predict your moods. Do not let a bad situation be your excuse to have a cigarette. Some situations in your life might tempt you to have a cigarette.  Ask friends and co-workers not to smoke around you.  Make your home smoke-free.  Never have "just one" cigarette. It leads to wanting another and another. Remind yourself of your decision to quit.  On a card, make a list of your reasons for not smoking. Read it at least the same number of times a day as you have a cigarette. Tell yourself everyday, "I do not want to smoke. I choose not to smoke."  Ask someone at home or work to help you with your plan to quit smoking.  Have something planned after you eat or have a cup of coffee. Take a walk or get other exercise to perk you up. This will help to keep you from  overeating.  Try a relaxation exercise to calm you down and decrease your stress. Remember, you may be tense and nervous the first two weeks after you quit. This will pass.  Find new activities to keep your hands busy. Play with a pen, coin, or rubber band. Doodle or draw things on paper.  Brush your teeth right after eating. This will help cut down the craving for the taste of tobacco after meals. You can try mouthwash too.  Try gum, breath mints, or diet candy to keep something in your mouth. IF YOU SMOKE AND WANT TO QUIT:  Do not stock up on cigarettes. Never buy a carton. Wait until one pack is finished before you buy another.  Never carry cigarettes with you at work or at home.  Keep cigarettes as far away from you as possible. Leave them with someone else.  Never carry matches or a lighter with you.  Ask yourself, "Do I need this cigarette or is this just a reflex?"  Bet with someone that you can quit. Put cigarette money in a piggy bank every morning. If you smoke, you give up the money. If you do not smoke, by the end of the week, you keep the money.  Keep trying. It takes 21 days to change a habit!  Talk to your doctor about using medicines to help you quit. These include nicotine replacement gum, lozenges, or skin patches. Document Released: 05/17/2009 Document Revised: 10/13/2011 Document Reviewed: 05/17/2009 [facility] Patient Information 2015 [place], [place]. This information is not intended to replace advice given to you by your health care provider. Make sure you discuss any  questions you have with your health care provider.

## 2014-02-07 NOTE — CV Procedure (Signed)
     Left Heart Catheterization with Coronary Angiography  Report  [name]  53 y.o.  male [birth year]  Procedure Date: 02/07/2014 Referring Physician: Royann Shiversroitoru, MD Primary Cardiologist:: Dietrich PatesPaula Ross, MD  INDICATIONS: Chest pain consistent with unstable angina.  PROCEDURE: 1. Left heart catheterization; 2. Coronary angiography; 3. Left ventriculography  CONSENT:  The risks, benefits, and details of the procedure were explained in detail to the patient. Risks including death, stroke, heart attack, kidney injury, allergy, limb ischemia, bleeding and radiation injury were discussed.  The patient verbalized understanding and wanted to proceed.  Informed written consent was obtained.  PROCEDURE TECHNIQUE:  After Xylocaine anesthesia a 5 French Slender sheath was placed in the right radial artery with an angiocath and the modified Seldinger technique.  Coronary angiography was done using a 5 F JL 3.5 cm and JR 4 diagnostic catheters.  Left ventriculography was done using the JR 4 catheter and hand injection.   Digital images were reviewed. Significant calcification was noted. Intracoronary nitroglycerin, 200 mcg, was administered. No significant obstructive lesions were identified.  The case was terminated and hemostasis achieved with a wrist band.   CONTRAST:  Total of 100 cc.  COMPLICATIONS:  None   HEMODYNAMICS:  Aortic pressure 150/99 mmHg; LV pressure 154/11 mmHg; LVEDP 16 mmHg  ANGIOGRAPHIC DATA:   The left main coronary artery is moderately calcified but widely patent..  The left anterior descending artery is widely patent but contains moderate proximal and mid calcification. Irregularities are noted in the first diagonal. No significant obstructions noted.  The left circumflex artery is widely patent but contains proximal to mid moderate to heavy calcification. Irregularities are noted within this region. No significant obstruction is noted..  The right coronary artery  is widely patent and contains proximal to mid moderate calcification. No significant obstruction is noted.Marland Kitchen.   LEFT VENTRICULOGRAM:  Left ventricular angiogram was done in the 30 RAO projection and revealed low normal to mildly depressed LV function with an ejection fraction estimated at 50%   IMPRESSIONS:  1. Moderate to severe three-vessel coronary calcification by cinefluoroscopy  2. Minimal luminal regularity is noted in the proximal to mid LAD and diagonal, and the proximal circumflex. No significant obstructive coronary disease is identified.  3. Low normal left ventricular systolic function estimated ejection fraction is 45 to 50%. Normal left ventricular end-diastolic pressures are   RECOMMENDATION:  Aspirin, statin therapy, and aggressive blood pressure control. Could potentially be discharged later today. Will need close followup over time to ensure medication compliance..Marland Kitchen

## 2014-02-08 NOTE — Discharge Summary (Signed)
[facility] Discharge Summary  Patient name: [name] Medical record number: [id number] Date of birth: Jan 16, 1961 Age: 53 y.o. Gender: male Date of Admission: 02/06/2014  Date of Discharge: 02/07/2014 Admitting Physician: Uvaldo RisingKyle J Fletke, MD  Primary Care Provider: Remus LofflerJones, Angel S, PA-C Consultants: Cardiology  Indication for Hospitalization: Typical angina  Discharge Diagnoses/Problem List:  Chest pain HTN Hyperlipidemia OSA  Disposition: Discharge home  Discharge Condition: Stable  Discharge Exam:  Temp: [97.7 F (36.5 C)-98.1 F (36.7 C)] 98.1 F (36.7 C) (07/07 0400)  Pulse Rate: [54-99] 54 (07/07 0400)  Resp: [10-20] 18 (07/07 0400)  BP: (102-138)/(70-94) 130/83 mmHg (07/07 0400)  SpO2: [96 %-100 %] 97 % (07/07 0400)  Weight: [200 lb (90.719 kg)-200 lb 11.2 oz (91.037 kg)] 200 lb 11.2 oz (91.037 kg) (07/07 0400)  Physical Exam:  General: Pleasant 53 y.o. male laying in bed in NAD  HEENT: NCAT, sclerae normal, oropharynx clear with good dentition  Cardiovascular: RRR, no murmur or gallop, no JVD or carotid bruits, 2+ radial and DP pulses.  Respiratory: Non-labored breathing on RA, CTAB. No wheezing, rhonchi, or crackles noted.  Abdomen: Normoactive bowel sounds, soft, NDNT  Extremities: Warm, dry, cap refill < 2 sec., no tenderness to palpation or swelling  Skin: No rashes, wounds, or lesions  Neuro: Alert and oriented, normal speech.  Brief Hospital Course:  Patient presented with substernal chest pain with associated SOB and diaphoresis that was exertional in nature and relieved by rest and nitroglycerin. EKG with no indications of ischemia or infarction. Troponins negative. CXR clear. Cardiology consulted given concerning presentation. Echocardiogram (patient endorsed orthopnea) and cardiac catheterization performed. Pt found to have elevated cholesterol, LDL, and triglycerides. History of myalgias with Lipitor 40mg  in the past.  Re-started patient on Lipitor 20mg  prior to discharge. Beta blocker started. Patient has a history of OSA but hasn't been using his CPAP as it was not covered by insurance.  Issues for Follow Up:  - Explore community resources for obtaining CPAP at home  - Ensure medication compliance and possible barriers to medication compliance  - F/u myalgias- hopefully he will be asymptomatic at a lower dose.  Significant Procedures:  Left Heart Catheterization with Coronary Angiography 1. Moderate to severe three-vessel coronary calcification by cinefluoroscopy  2. Minimal luminal regularity is noted in the proximal to mid LAD and diagonal, and the proximal circumflex. No significant obstructive coronary disease is identified.  3. Low normal left ventricular systolic function estimated ejection fraction is 45 to 50%. Normal left ventricular end-diastolic pressures    Significant Labs and Imaging:   Recent Labs Lab 02/06/14 1245  WBC 9.0  HGB 17.1*  HCT 49.2  PLT 281    Recent Labs Lab 02/06/14 1245  NA 139  K 4.2  CL 101  CO2 25  GLUCOSE 95  BUN 12  CREATININE 0.86  CALCIUM 10.1   Risk Stratification Labs  TSH    Component Value Date/Time   TSH 1.780 02/06/2014 1826   Hemoglobin A1C    Component Value Date/Time   HGBA1C 5.5 02/06/2014 1826   Lipid Panel     Component Value Date/Time   CHOL 242* 02/07/2014 0750   TRIG 215* 02/07/2014 0750   HDL 45 02/07/2014 0750   CHOLHDL 5.4 02/07/2014 0750   VLDL 43* 02/07/2014 0750   LDLCALC 154* 02/07/2014 0750   Dg Chest 2 View  02/06/2014   CLINICAL DATA:  Chest pain.  EXAM: CHEST - 2 VIEW  COMPARISON:  09/09/2010  FINDINGS: The heart size and mediastinal contours are within normal limits. There is no evidence of pulmonary edema, consolidation, pneumothorax, nodule or pleural fluid. The visualized skeletal structures are unremarkable.  IMPRESSION: No active disease.   Electronically Signed   By: Irish LackGlenn  Yamagata M.D.   On: 02/06/2014 14:08     Echocardiogram: - Left ventricle: The cavity size was normal. Wall thickness was increased in a pattern of mild LVH. Systolic function was normal. The estimated ejection fraction was in the range of 50% to 55%. Wall motion was normal; there were no regional wall motion abnormalities. There was an increased relative contribution of atrial contraction to ventricular filling. - Right atrium: The atrium was moderately dilated.    Results/Tests Pending at Time of Discharge: None  Discharge Medications:    Medication List    STOP taking these medications       aspirin 325 MG tablet  Replaced by:  aspirin EC 81 MG tablet      TAKE these medications       aspirin EC 81 MG tablet  Take 1 tablet (81 mg total) by mouth daily.     atorvastatin 20 MG tablet  Commonly known as:  LIPITOR  Take 1 tablet (20 mg total) by mouth daily at 6 PM.     desloratadine 5 MG tablet  Commonly known as:  CLARINEX  Take 5 mg by mouth daily.     famotidine 20 MG tablet  Commonly known as:  PEPCID  Take 20 mg by mouth 2 (two) times daily.     metoprolol tartrate 25 MG tablet  Commonly known as:  LOPRESSOR  Take 1 tablet (25 mg total) by mouth 2 (two) times daily.     montelukast 10 MG tablet  Commonly known as:  SINGULAIR  Take 10 mg by mouth at bedtime.     valsartan 160 MG tablet  Commonly known as:  DIOVAN  Take 160 mg by mouth daily.        Discharge Instructions: Please refer to Patient Instructions section of EMR for full details.  Patient was counseled important signs and symptoms that should prompt return to medical care, changes in medications, dietary instructions, activity restrictions, and follow up appointments.   Follow-Up Appointments:     Follow-up Information   Follow up with Remus LofflerJones, Angel S, PA-C. Call today. (Make a follow up appointment for 1-2 weeks after discharge)    Specialty:  General Practice   Contact information:   [address] [place]  [id number] [phone]       Joanna Puffrystal S Dorsey, MD 02/08/2014, 7:57 PM PGY-1, [facility]

## 2014-02-09 NOTE — Discharge Summary (Signed)
I agree with the discharge summary as documented.   Kyle Fletke MD  

## 2014-07-12 ENCOUNTER — Telehealth: Payer: Self-pay | Admitting: Interventional Cardiology

## 2014-07-12 ENCOUNTER — Ambulatory Visit (INDEPENDENT_AMBULATORY_CARE_PROVIDER_SITE_OTHER): Payer: 59 | Admitting: Nurse Practitioner

## 2014-07-12 ENCOUNTER — Encounter: Payer: Self-pay | Admitting: Nurse Practitioner

## 2014-07-12 VITALS — BP 138/80 | HR 58 | Ht 69.0 in | Wt 212.0 lb

## 2014-07-12 DIAGNOSIS — E785 Hyperlipidemia, unspecified: Secondary | ICD-10-CM

## 2014-07-12 DIAGNOSIS — I2510 Atherosclerotic heart disease of native coronary artery without angina pectoris: Secondary | ICD-10-CM | POA: Insufficient documentation

## 2014-07-12 DIAGNOSIS — I10 Essential (primary) hypertension: Secondary | ICD-10-CM | POA: Insufficient documentation

## 2014-07-12 DIAGNOSIS — R002 Palpitations: Secondary | ICD-10-CM

## 2014-07-12 LAB — BASIC METABOLIC PANEL
BUN: 11 mg/dL (ref 6–23)
CALCIUM: 8.8 mg/dL (ref 8.4–10.5)
CHLORIDE: 102 meq/L (ref 96–112)
CO2: 26 meq/L (ref 19–32)
CREATININE: 0.9 mg/dL (ref 0.4–1.5)
GFR: 100.98 mL/min (ref >60.00)
Glucose, Bld: 98 mg/dL (ref 70–99)
Potassium: 3.8 mEq/L (ref 3.5–5.1)
Sodium: 135 mEq/L (ref 135–145)

## 2014-07-12 LAB — MAGNESIUM: Magnesium: 1.8 mg/dL (ref 1.5–2.5)

## 2014-07-12 LAB — TSH: TSH: 1.21 u[IU]/mL (ref 0.35–4.50)

## 2014-07-12 MED ORDER — METOPROLOL TARTRATE 25 MG PO TABS: 12.5000 mg | ORAL_TABLET | Freq: Two times a day (BID) | ORAL | Status: DC
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
## 2014-07-12 NOTE — Telephone Encounter (Signed)
Walk in pt Form " Attending Physicians Statement" Dropped Off Sent to [facility]

## 2014-07-12 NOTE — Telephone Encounter (Signed)
New problem   Pt stated his heart is skipping beats and his BP is low. Pt want to speak to nurse to see if he can come in office today.

## 2014-07-12 NOTE — Telephone Encounter (Signed)
Returned patient's call. Patient states "My heart is skipping beats and my blood pressure is low". Patient started having symptoms of palpations yesterday while driving. Palpations continued into the evening off and on, and then this morning. Patient is having tightness in the chest, no energy, numbing and tingling in the hands when standing. BP 111/72 HR 91 first thing this morning and then most recent BP 114/69 HR 73. Talked to Ward Givenshris Berge NP who is flex today. He wants patient to come in to get an EKG. Set up appointment today at 11:00. Patient agreed with this appointment and will be coming at that time.  Cindi CarbonPamela Pate RN

## 2014-07-12 NOTE — Patient Instructions (Addendum)
Your physician has recommended you make the following change in your medication:  1.) METOPROLOL 12.5 MG TWICE DAILY Your physician recommends that you return for lab work in: TODAY (BMET, MAG, TSH) Your physician has recommended that you wear a holter monitor. Holter monitors are medical devices that record the heart's electrical activity. Doctors most often use these monitors to diagnose arrhythmias. Arrhythmias are problems with the speed or rhythm of the heartbeat. The monitor is a small, portable device. You can wear one while you do your normal daily activities. This is usually used to diagnose what is causing palpitations/syncope (passing out).   Your physician recommends that you schedule a follow-up appointment ON Jul 25, 2014 WITH CHRIS BERGE

## 2014-07-13 ENCOUNTER — Encounter (HOSPITAL_COMMUNITY): Payer: Self-pay | Admitting: Interventional Cardiology

## 2014-07-14 ENCOUNTER — Encounter: Payer: Self-pay | Admitting: Radiology

## 2014-07-14 ENCOUNTER — Encounter (INDEPENDENT_AMBULATORY_CARE_PROVIDER_SITE_OTHER): Payer: BC Managed Care – PPO

## 2014-07-14 DIAGNOSIS — R002 Palpitations: Secondary | ICD-10-CM

## 2014-07-14 NOTE — Progress Notes (Signed)
E cardio 48hr holter applied. 

## 2014-07-21 ENCOUNTER — Ambulatory Visit: Payer: 59 | Admitting: Nurse Practitioner

## 2014-07-25 ENCOUNTER — Ambulatory Visit: Payer: BC Managed Care – PPO | Admitting: Nurse Practitioner

## 2014-08-02 ENCOUNTER — Telehealth: Payer: Self-pay

## 2014-08-02 NOTE — Telephone Encounter (Signed)
Pt aware of holter results -Rare PVC's with low level (<35%) Correlation with complaint -No action needed Pt verbalized understanding.

## 2014-08-11 ENCOUNTER — Encounter: Payer: Self-pay | Admitting: Nurse Practitioner

## 2014-08-11 ENCOUNTER — Ambulatory Visit (INDEPENDENT_AMBULATORY_CARE_PROVIDER_SITE_OTHER): Payer: BC Managed Care – PPO | Admitting: Nurse Practitioner

## 2014-08-11 VITALS — BP 140/88 | HR 66 | Ht 69.0 in | Wt 212.0 lb

## 2014-08-11 DIAGNOSIS — R002 Palpitations: Secondary | ICD-10-CM

## 2014-08-11 DIAGNOSIS — E785 Hyperlipidemia, unspecified: Secondary | ICD-10-CM
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
## 2014-08-11 NOTE — Patient Instructions (Signed)
Your physician recommends that you continue on your current medications as directed. Please refer to the Current Medication list given to you today.   Your physician wants you to follow-up in: 6 months with Dr. Smith.  You will receive a reminder letter in the mail two months in advance. If you don't receive a letter, please call our office to schedule the follow-up appointment @ [phone]  

## 2014-09-08 ENCOUNTER — Ambulatory Visit: Payer: 59 | Admitting: Interventional Cardiology

## 2015-06-05 ENCOUNTER — Ambulatory Visit: Payer: BLUE CROSS/BLUE SHIELD | Admitting: Podiatry

## 2015-06-15 ENCOUNTER — Ambulatory Visit (INDEPENDENT_AMBULATORY_CARE_PROVIDER_SITE_OTHER): Payer: BLUE CROSS/BLUE SHIELD | Admitting: Podiatry

## 2015-06-15 ENCOUNTER — Ambulatory Visit (INDEPENDENT_AMBULATORY_CARE_PROVIDER_SITE_OTHER): Payer: BLUE CROSS/BLUE SHIELD

## 2015-06-15 ENCOUNTER — Encounter: Payer: Self-pay | Admitting: Podiatry

## 2015-06-15 VITALS — BP 114/72 | HR 77 | Resp 16

## 2015-06-15 DIAGNOSIS — M205X1 Other deformities of toe(s) (acquired), right foot: Secondary | ICD-10-CM

## 2015-06-15 DIAGNOSIS — M2012 Hallux valgus (acquired), left foot: Secondary | ICD-10-CM

## 2015-06-15 DIAGNOSIS — M2010 Hallux valgus (acquired), unspecified foot: Secondary | ICD-10-CM

## 2015-06-15 NOTE — Progress Notes (Signed)
   Subjective:    Patient ID: [name], male    DOB: 09/04/1960, 54 y.o.   MRN: [id number]  HPI Comments: "I have trouble with my big toe joints"  Patient presents with: Foot Pain: 1st MPJ bilateral - aching for few months, limited ROM 1st MPJ right, some numbness and tingling, redness, PCP Rx'd meloxicam-helped some.    Foot Pain      Review of Systems  HENT: Positive for sinus pressure.   Eyes: Positive for visual disturbance.  Skin:       Change in nails   All other systems reviewed and are negative.      Objective:   Physical Exam        Assessment & Plan:

## 2015-06-17 NOTE — Progress Notes (Signed)
Subjective:     Patient ID: [name], male   DOB: Jun 26, 1961, 54 y.o.   MRN: [id number]  HPI patient states I've had a lot of problems with my big toe joint right over left and it's becoming increasingly tender. I been on meloxicam which helps a little bit and it's been going on for a long time getting worse recently and I know on getting need to have some form of surgery   Review of Systems  All other systems reviewed and are negative.      Objective:   Physical Exam  Constitutional: He is oriented to person, place, and time.  Cardiovascular: Intact distal pulses.   Musculoskeletal: Normal range of motion.  Neurological: He is oriented to person, place, and time.  Skin: Skin is warm.  Nursing note and vitals reviewed.  neurovascular status intact muscle strength adequate range of motion within normal limits with patient noted to have severe arthritis and spur formation of the right big toe joint with minimal motion noted and pain both medial lateral and dorsal. Left also has mild disease but not to the same degree and patient is noted to have good digital perfusion and is well oriented 3     Assessment:     Severe hallux limitus with inflammatory capsulitis first MPJ right over left foot with spur formation    Plan:     H&P and x-rays reviewed with patient. Today I did do an injection to try to give temporary relief and he would like surgery in January and I discussed the possibility for joint reconstruction with spur removal or possible implant procedure if the cartilage turns out to be completely damaged at this time. Educated him on this and reappoint 4 weeks to review

## 2015-07-13 ENCOUNTER — Encounter: Payer: Self-pay | Admitting: Podiatry

## 2015-07-13 ENCOUNTER — Ambulatory Visit (INDEPENDENT_AMBULATORY_CARE_PROVIDER_SITE_OTHER): Payer: BLUE CROSS/BLUE SHIELD | Admitting: Podiatry

## 2015-07-13 VITALS — BP 151/94 | HR 69 | Resp 16

## 2015-07-13 DIAGNOSIS — M205X1 Other deformities of toe(s) (acquired), right foot: Secondary | ICD-10-CM

## 2015-07-13 NOTE — Patient Instructions (Signed)
Pre-Operative Instructions  Congratulations, you have decided to take an important step to improving your quality of life.  You can be assured that the doctors of [facility] will be with you every step of the way.  1. Plan to be at the surgery center/hospital at least 1 (one) hour prior to your scheduled time unless otherwise directed by the surgical center/hospital staff.  You must have a responsible adult accompany you, remain during the surgery and drive you home.  Make sure you have directions to the surgical center/hospital and know how to get there on time. 2. For hospital based surgery you will need to obtain a history and physical form from your family physician within 1 month prior to the date of surgery- we will give you a form for you primary physician.  3. We make every effort to accommodate the date you request for surgery.  There are however, times where surgery dates or times have to be moved.  We will contact you as soon as possible if a change in schedule is required.   4. No Aspirin/Ibuprofen for one week before surgery.  If you are on aspirin, any non-steroidal anti-inflammatory medications (Mobic, Aleve, Ibuprofen) you should stop taking it 7 days prior to your surgery.  You make take Tylenol  For pain prior to surgery.  5. Medications- If you are taking daily heart and blood pressure medications, seizure, reflux, allergy, asthma, anxiety, pain or diabetes medications, make sure the surgery center/hospital is aware before the day of surgery so they may notify you which medications to take or avoid the day of surgery. 6. No food or drink after midnight the night before surgery unless directed otherwise by surgical center/hospital staff. 7. No alcoholic beverages 24 hours prior to surgery.  No smoking 24 hours prior to or 24 hours after surgery. 8. Wear loose pants or shorts- loose enough to fit over bandages, boots, and casts. 9. No slip on shoes, sneakers are best. 10. Bring  your boot with you to the surgery center/hospital.  Also bring crutches or a walker if your physician has prescribed it for you.  If you do not have this equipment, it will be provided for you after surgery. 11. If you have not been contracted by the surgery center/hospital by the day before your surgery, call to confirm the date and time of your surgery. 12. Leave-time from work may vary depending on the type of surgery you have.  Appropriate arrangements should be made prior to surgery with your employer. 13. Prescriptions will be provided immediately following surgery by your doctor.  Have these filled as soon as possible after surgery and take the medication as directed. 14. Remove nail polish on the operative foot. 15. Wash the night before surgery.  The night before surgery wash the foot and leg well with the antibacterial soap provided and water paying special attention to beneath the toenails and in between the toes.  Rinse thoroughly with water and dry well with a towel.  Perform this wash unless told not to do so by your physician.  Enclosed: 1 Ice pack (please put in freezer the night before surgery)   1 Hibiclens skin cleaner   Pre-op Instructions  If you have any questions regarding the instructions, do not hesitate to call our office.  [place]: [address] [place], [place] [address] [phone]  [place]: [address] , [place] [address] [phone]  [place]: [address]  [place], [place] [address] [phone]  Dr. Richard   Tuchman DPM, Dr. Norman Regal DPM Dr. Richard Sikora DPM, Dr. M. Todd Hyatt DPM, Dr. Kathryn Egerton DPM 

## 2015-07-15 NOTE — Progress Notes (Signed)
Subjective:     Patient ID: [name], male   DOB: Oct 26, 1960, 54 y.o.   MRN: [id number]  HPI patient presents stating I'm ready to get this big toe joint fixed as it remains persistently sore has not responded well to other treatments and makes it hard for me to walk with any degree of comfort   Review of Systems     Objective:   Physical Exam Neurovascular status intact muscle strength adequate with reduced range of motion first MPJ right with crepitus within the joint and dorsal spurring noted    Assessment:     Hallux limitus condition confirmed on x-ray with clinical signs of pain and reduced motion    Plan:     Reviewed condition at great length and discussed surgical intervention and allow patient to read consent form reviewing alternative treatments complications. Patient stands is no guarantee and that ultimately this may require joint implantation or fusion procedure and he wants to go ahead understanding we may need to do an implant at the time of first procedure if the cartilage is not viable. He signed consent form after extensive review and is scheduled for outpatient surgery and understands total recovery can be from 6 months to one year and at this time is dispensed air fracture walker with instructions on usage. Encouraged to call with any questions prior to procedure

## 2015-07-31 ENCOUNTER — Telehealth: Payer: Self-pay | Admitting: *Deleted

## 2015-07-31 DIAGNOSIS — M2021 Hallux rigidus, right foot: Secondary | ICD-10-CM | POA: Diagnosis not present

## 2015-07-31 NOTE — Telephone Encounter (Addendum)
Pt states he thinks he began to have itching all over, before he took his oxycodone, it's not unbearable but he wanted to know what to do.  Pt states they gave him Penicillin in his IV at the surgery center and he knows his dtr is allergic to it, so may be he is as well.  I told pt to take over the counter Benadryl as directed by the package and I would call with further instructions, if shortness of breath, difficulty breathing, facial or mouth swelling or hives go to ER.  I spoke with Dr. Al CorpusHyatt, he states it could be the IV antibiotic but it would not have been a Penicillin, it could also be a untoward affect from the anesthesia, causing a crawly or itching sensation. Dr. Al CorpusHyatt states continue the Benadryl and be aware of symptoms of allergic reaction, when taking the pain medication.  Informed pt and told to call with concerns.  08/01/2015 - pt called states toes are numb.  Informed pt that the ace wrap may be too tight, to sit down, remove the boot, open-ended sock, and ace wrap without disturbing the dressing, then elevate the foot for 15 minutes or if this makes the foot more uncomfortable dangle it for 15 minutes, after 15 minutes put foot level and rewrap the ace looser, replace sock and boot.  Pt states he has noticed he does have the itching with the Oxycodone, and has continued to take the Benadryl.  Pt asked if he could take Hydrocodone from a previous prescription instead of the Oxycodone.  I told pt that would be fine and I would inform Dr. Charlsie Merlesegal of the change, and if pain medication was needed we would prescribe Hydrocodone. Pt agreed.

## 2015-08-03 ENCOUNTER — Telehealth: Payer: Self-pay | Admitting: *Deleted

## 2015-08-03 NOTE — Telephone Encounter (Signed)
"  I have a piece of bone here.  There's no history or any information about the specimen.  Where did it come from?"  It was on the hallux.  [facility] surgery on the right foot.  Diagnosis is Hallux Limitus.  "Does he have a history of Gout?"  I don't see anything in his history about Gout.  "Okay thank you, that helps a lot."

## 2015-08-07 NOTE — Telephone Encounter (Signed)
This patient calls over the weekend with a problem with his surgical foot. He had a biplane Austin performed by Dr. Charlsie Merlesregal on 1227. He states that he has now developed a knot   behind the second and third toes of the foot. He feels pressure from the top of the knot to the bottom of the foot. He admits there is mild redness, but not the redness associated with an infection. He does  admit that there is some discoloration to the foot. He states that he had a problem last week and was told to loosen the bandage which she did. He states he did well following the loosening of the bandage until he went to church in his cam walker this morning. I told him to elevate his foot and take Advil and watch the knots. This do not worsens, he is to call the office immediately. Otherwise, we'll watch it and call the office Tuesday a.m. He may have aggravated the foot due to his bandage.

## 2015-08-10 ENCOUNTER — Ambulatory Visit (INDEPENDENT_AMBULATORY_CARE_PROVIDER_SITE_OTHER): Payer: BLUE CROSS/BLUE SHIELD | Admitting: Podiatry

## 2015-08-10 ENCOUNTER — Ambulatory Visit (INDEPENDENT_AMBULATORY_CARE_PROVIDER_SITE_OTHER): Payer: BLUE CROSS/BLUE SHIELD

## 2015-08-10 VITALS — Temp 98.6°F

## 2015-08-10 DIAGNOSIS — M205X1 Other deformities of toe(s) (acquired), right foot: Secondary | ICD-10-CM

## 2015-08-10 DIAGNOSIS — Z09 Encounter for follow-up examination after completed treatment for conditions other than malignant neoplasm: Secondary | ICD-10-CM

## 2015-08-13 NOTE — Progress Notes (Signed)
Patient ID: [name] [place], male   DOB: 21-Apr-1961, 55 y.o.   MRN: [id number]  Subjective: [name] Hitsman is Diaz 55 y.o. is seen today in office s/p right bicorrectional Eliberto Ivoryustin preformed on 07/31/15 with Dr. Charlsie Merlesegal. They state their pain is controlled and only having mild pain intermittently. He has continued with CAM boot. Denies any systemic complaints such as fevers, chills, nausea, vomiting. No calf pain, chest pain, shortness of breath.   Objective: General: No acute distress, AAOx3  DP/PT pulses palpable 2/4, CRT < 3 sec to all digits.  Protective sensation intact. Motor function intact.  Right foot: Incision is well coapted without any evidence of dehiscence. There is no surrounding erythema, ascending cellulitis, fluctuance, crepitus, malodor, drainage/purulence. There is mild edema around the surgical site, mostly along the 1st interspace. There is minimal pain along the surgical site.  No other areas of tenderness to bilateral lower extremities.  No other open lesions or pre-ulcerative lesions.  No pain with calf compression, swelling, warmth, erythema.   Assessment and Plan:  Status post right foot surgery, doing well with no complications   -X-rays were obtained and reviewed with the patient.  -Treatment options discussed including all alternatives, risks, and complications -Antibiotic ointment applied followed by Diaz dressing.  -Continue CAM boot -Ice/elevation -Pain medication as needed. -Monitor for any clinical signs or symptoms of infection and DVT/PE and directed to call the office immediately should any occur or go to the ER. -Follow-up in 1 week for suture removal  or sooner if any problems arise. In the meantime, encouraged to call the office with any questions, concerns, change in symptoms.   Ovid CurdMatthew Dorian Duval, DPM

## 2015-08-17 ENCOUNTER — Telehealth: Payer: Self-pay | Admitting: *Deleted

## 2015-08-17 NOTE — Telephone Encounter (Addendum)
Pt states the skin has grown over the knots to his stitches, can he come in to get them cut?  Dr. Charlsie Merlesegal states yes, I informed pt, and he will be here in 30 minutes.  08/17/2015 at 348pm - Pt's right bunionectomy site had suture knots distal and proximal and the center of the incisions site was open superficially and had evidence it had been covered with a bandaid.  I cleansed the knot sites and the center of the incision with 3 separate betadine soaked gauze, and removed the knots cleansed each end with separate betadine soaked gauze and applied thin layer of Neosporin ointment to spot bandaids, and a dry bandaid strap to the central incision site.

## 2015-08-27 ENCOUNTER — Ambulatory Visit: Payer: Self-pay

## 2015-08-27 ENCOUNTER — Ambulatory Visit (INDEPENDENT_AMBULATORY_CARE_PROVIDER_SITE_OTHER): Payer: BLUE CROSS/BLUE SHIELD | Admitting: Podiatry

## 2015-08-27 ENCOUNTER — Encounter: Payer: Self-pay | Admitting: Podiatry

## 2015-08-27 DIAGNOSIS — M205X1 Other deformities of toe(s) (acquired), right foot: Secondary | ICD-10-CM | POA: Diagnosis not present

## 2015-08-27 DIAGNOSIS — Z09 Encounter for follow-up examination after completed treatment for conditions other than malignant neoplasm: Secondary | ICD-10-CM

## 2015-08-28 ENCOUNTER — Encounter: Payer: Self-pay | Admitting: Podiatry

## 2015-08-28 NOTE — Progress Notes (Signed)
Subjective:     Patient ID: [name], male   DOB: 1960/09/19, 55 y.o.   MRN: [id number]  HPI patient states I'm doing really well with minimal discomfort and good range of motion   Review of Systems     Objective:   Physical Exam  neurovascular status intact with excellent range of motion first MPJ right with wound edges well coapted and good alignment of the metatarsal phalangeal joint    Assessment:      doing well post osteotomy first metatarsal right    Plan:      H&P x-rays reviewed and discussed continued immobilization elevation and the fact that there still some bone healing still to occur. Patient understands this and will be seen back for [place] to recheck and at this time was given instructions on the importance of motion

## 2015-09-07 ENCOUNTER — Ambulatory Visit (INDEPENDENT_AMBULATORY_CARE_PROVIDER_SITE_OTHER): Payer: BLUE CROSS/BLUE SHIELD

## 2015-09-07 ENCOUNTER — Encounter: Payer: Self-pay | Admitting: Podiatry

## 2015-09-07 ENCOUNTER — Ambulatory Visit (INDEPENDENT_AMBULATORY_CARE_PROVIDER_SITE_OTHER): Payer: BLUE CROSS/BLUE SHIELD | Admitting: Podiatry

## 2015-09-07 ENCOUNTER — Telehealth: Payer: Self-pay | Admitting: *Deleted

## 2015-09-07 VITALS — BP 129/84 | HR 74 | Temp 97.8°F | Resp 16

## 2015-09-07 DIAGNOSIS — M205X1 Other deformities of toe(s) (acquired), right foot: Secondary | ICD-10-CM

## 2015-09-07 DIAGNOSIS — Z9889 Other specified postprocedural states: Secondary | ICD-10-CM

## 2015-09-07 MED ORDER — CEPHALEXIN 500 MG PO CAPS: 500.0000 mg | ORAL_CAPSULE | Freq: Three times a day (TID) | ORAL | Status: DC

## 2015-09-07 NOTE — Telephone Encounter (Signed)
Pt left message with DOB, and phone number.  Pt states has a spot on the top of the incision that is red and swollen and clear fluid oozing out.  Dr. Ardelle Anton states have pt come in today.  Pt agreed and will be here before 400pm.

## 2015-09-07 NOTE — Patient Instructions (Signed)
Soak Instructions    THE DAY AFTER THE PROCEDURE  Place 1/4 cup of epsom salts in a quart of warm tap water.  Submerge your foot or feet with outer bandage intact for the initial soak; this will allow the bandage to become moist and wet for easy lift off.  Once you remove your bandage, continue to soak in the solution for 20 minutes.  This soak should be done twice a day.  Next, remove your foot or feet from solution, blot dry the affected area and cover.  You may use a band aid large enough to cover the area or use gauze and tape.  Apply other medications to the area as directed by the doctor such as polysporin neosporin.  Monitor for any signs/symptoms of infection. Call the office immediately if any occur or go directly to the emergency room. Call with any questions/concerns.

## 2015-09-10 ENCOUNTER — Encounter: Payer: Self-pay | Admitting: Podiatry

## 2015-09-10 NOTE — Progress Notes (Signed)
Patient ID: [name], male   DOB: 07-12-1961, 55 y.o.   MRN: [id number]  Subjective: [name] is a 55 y.o. is seen today in office s/p right bilplanar Eliberto Ivory bunionectomy preformed on 07/31/15 with Dr. Charlsie Merles. He states that over the last day or so he has noticed increased pain and swelling on the surgical site and he has had some clear drainage coming from the incision this morning. Denies any redness or warmth around the area. There is an increase in swelling as well as pain. Denies any systemic complaints such as fevers, chills, nausea, vomiting. No calf pain, chest pain, shortness of breath.   Objective: General: No acute distress, AAOx3  DP/PT pulses palpable 2/4, CRT < 3 sec to all digits.  Protective sensation intact. Motor function intact.  Right foot: Incision is well coapted without any evidence of dehiscence.  There is no drainage expressed today.There is no surrounding erythema, ascending cellulitis, fluctuance, crepitus, malodor, drainage/purulence. There is moderate edema around the surgical site. There is pain to palpation along the surgical site.  There is no drainage expressed today and there is no sensory redness or red streaks. No other areas of tenderness to bilateral lower extremities.  No other open lesions or pre-ulcerative lesions.  No pain with calf compression, swelling, warmth, erythema.   Assessment and Plan:  Status post  right foot bunion correction with increased swelling and pain and subjective drainage coming from the area.  -Treatment options discussed including all alternatives, risks, and complications -X-rays were obtained and reviewed with the patient. Stedman site evident. Hardware intact. -Recommend return to the cam boot or surgical shoe. -Will start Keflex. -Ice/elevation -Pain medication as needed. -Monitor for any clinical signs or symptoms of infection and DVT/PE and directed to call the office immediately should any occur or go  to the ER. -Follow-up as scheduled or sooner if any problems arise. In the meantime, encouraged to call the office with any questions, concerns, change in symptoms.   Ovid Curd, DPM

## 2015-09-21 ENCOUNTER — Ambulatory Visit: Payer: Self-pay

## 2015-09-21 ENCOUNTER — Ambulatory Visit (INDEPENDENT_AMBULATORY_CARE_PROVIDER_SITE_OTHER): Payer: BLUE CROSS/BLUE SHIELD | Admitting: Podiatry

## 2015-09-21 DIAGNOSIS — M205X1 Other deformities of toe(s) (acquired), right foot: Secondary | ICD-10-CM

## 2015-09-21 DIAGNOSIS — Z9889 Other specified postprocedural states: Secondary | ICD-10-CM | POA: Diagnosis not present

## 2015-09-24 NOTE — Progress Notes (Signed)
Patient ID: [name], male   DOB: 10/30/1960, 55 y.o.   MRN: [id number]  Subjective: [name] is a 55 y.o. is seen today in office s/p right bilplanar [name] bunionectomy preformed on 07/31/15 with Dr. Charlsie Diaz. He states that since last appointment swelling has greatly improved he has not no sign any drainage from the incision he is finished his course of X. He does continue to wear regular shoe. His only concern today is that his big toes not purchasing the ground. No recent injury or trauma. No tingling or numbness. Denies any systemic complaints such as fevers, chills, nausea, vomiting. No calf pain, chest pain, shortness of breath.   Objective: General: No acute distress, AAOx3  DP/PT pulses palpable 2/4, CRT < 3 sec to all digits.  Protective sensation intact. Motor function intact.  Right foot: Incision is well coapted without any evidence of dehiscence.  There is no drainage expressed today.There is no surrounding erythema, ascending cellulitis, fluctuance, crepitus, malodor, drainage/purulence. There is decreased edema around the surgical site. There is decreased pain to palpation along the surgical site.  Upon weightbearing the hallux that sits slightly off the ground. There is no clinical signs of infection today. No other areas of tenderness to bilateral lower extremities.  No other open lesions or pre-ulcerative lesions.  No pain with calf compression, swelling, warmth, erythema.   Assessment and Plan:  Status post  right foot bunion correction with decrease edema and pain in currently no signs of infection; hallux sitting slightly dorsiflexed  -Treatment options discussed including all alternatives, risks, and complications -X-rays were obtained and reviewed with the patient. Osteotomy site evident. Hardware intact. -Recommend continue with surgical shoe. -Ankle brace (W0981) was dispensed with toe strapping (19147) to help hold the toe down.   -Ice/elevation -Monitor for any clinical signs or symptoms of infection and DVT/PE and directed to call the office immediately should any occur or go to the ER. -Follow-up as scheduled with Dr. Charlsie Diaz or sooner if any problems arise. In the meantime, encouraged to call the office with any questions, concerns, change in symptoms.   [name], DPM

## 2015-10-19 ENCOUNTER — Ambulatory Visit (INDEPENDENT_AMBULATORY_CARE_PROVIDER_SITE_OTHER): Payer: BLUE CROSS/BLUE SHIELD

## 2015-10-19 ENCOUNTER — Ambulatory Visit (INDEPENDENT_AMBULATORY_CARE_PROVIDER_SITE_OTHER): Payer: BLUE CROSS/BLUE SHIELD | Admitting: Podiatry

## 2015-10-19 DIAGNOSIS — Z9889 Other specified postprocedural states: Secondary | ICD-10-CM

## 2015-10-19 DIAGNOSIS — Z09 Encounter for follow-up examination after completed treatment for conditions other than malignant neoplasm: Secondary | ICD-10-CM | POA: Diagnosis not present

## 2015-10-19 DIAGNOSIS — M205X1 Other deformities of toe(s) (acquired), right foot: Secondary | ICD-10-CM

## 2015-10-22 NOTE — Progress Notes (Signed)
Subjective:     Patient ID: [name], male   DOB: 1960/10/25, 55 y.o.   MRN: [id number]  HPI patient presents stating I'm doing real well with minimal discomfort   Review of Systems     Objective:   Physical Exam Neurovascular status intact muscle strength adequate range of motion within normal limits with patient found to have significant reduction of discomfort in the right first MPJ with wound edges that are well coapted    Assessment:     Doing well post hallux limitus repair first MPJ right    Plan:     Reviewed condition and recommended continuation of range of motion exercises. And reviewed x-rays. Patient will be seen back as needed  X-ray report indicated satisfactory healing of the osteotomy with pins in place and joint moderately compressed but no indications of advanced arthritis

## 2016-03-26 ENCOUNTER — Ambulatory Visit (INDEPENDENT_AMBULATORY_CARE_PROVIDER_SITE_OTHER): Payer: Worker's Compensation | Admitting: Physician Assistant

## 2016-03-26 ENCOUNTER — Ambulatory Visit: Payer: Worker's Compensation

## 2016-03-26 VITALS — BP 152/90 | HR 70 | Temp 97.9°F | Resp 16 | Ht 68.0 in | Wt 225.0 lb

## 2016-03-26 DIAGNOSIS — S8992XA Unspecified injury of left lower leg, initial encounter: Secondary | ICD-10-CM

## 2016-03-26 DIAGNOSIS — Z0289 Encounter for other administrative examinations: Secondary | ICD-10-CM

## 2016-03-26 NOTE — Progress Notes (Signed)
Lawrence MalteseGregory A Diaz [birth year] 55 y.o.   Chief Complaint  Patient presents with  . Knee Injury    WC, left, today     Date of Injury: today, 03/26/2016  History of Present Illness:  Presents for evaluation of work-related complaint.  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 He experienced immediate pain that he rated a 20 on a scale of 0-10. He took 2 OTC ibuprofen tablets and has had some improvement. At rest, the pain is 5/10, with weight bearing, 8/10.  There is no known previous injury to this knee, though he does have known DJD of both knees, L>R. He has never experienced pain like this before in the knees.   Review of Systems  Constitutional: Negative for chills, fever and malaise/fatigue.  Eyes: Negative for blurred vision and double vision.  Respiratory: Negative for cough and shortness of breath.   Cardiovascular: Negative for chest pain, palpitations and leg swelling.  Gastrointestinal: Negative for diarrhea, nausea and vomiting.  Genitourinary: Negative for dysuria, frequency and urgency.  Musculoskeletal: Positive for joint pain (knee). Negative for myalgias.  Skin: Negative for itching and rash.  Neurological: Negative for dizziness and headaches.  Endo/Heme/Allergies: Negative for polydipsia.  Psychiatric/Behavioral: The patient does not have insomnia.        Current medications and allergies reviewed and updated. Past medical history, family history, social history have been reviewed and updated.   Physical Exam  Constitutional: He is well-developed, well-nourished, and in no distress.  BP (!) 152/90 (BP Location: Right Arm, Patient Position: Sitting, Cuff Size: Normal)   Pulse 70   Temp 97.9 F (36.6 C)   Resp 16   Ht 5\' 8"  (1.727 m)   Wt 225 lb (102.1 kg)   SpO2 98%   BMI 34.21 kg/m    Eyes: Conjunctivae are normal. No scleral icterus.  Cardiovascular: Normal rate and intact distal pulses.     Pulmonary/Chest: Effort normal.  Musculoskeletal:       Left hip: Normal.       Left knee: He exhibits bony tenderness and abnormal meniscus. He exhibits normal range of motion, no swelling, no effusion, no ecchymosis, no deformity, no laceration, no erythema, normal alignment, no LCL laxity, normal patellar mobility and no MCL laxity. Tenderness found. Medial joint line (minimal) and patellar tendon (minimal) tenderness noted. No lateral joint line, no MCL and no LCL tenderness noted.       Left upper leg: Normal.       Left lower leg: Normal.       Legs:    Dg Knee Complete 4 Views Left  Result Date: 03/26/2016 CLINICAL DATA:  Left knee pain. EXAM: LEFT KNEE - COMPLETE 4+ VIEW COMPARISON:  09/12/2014 FINDINGS: No evidence of fracture, dislocation, or joint effusion. Stable mild medial joint space narrowing. No bony lesions or destruction. Soft tissues are unremarkable. IMPRESSION: No acute findings.  Stable mild osteoarthritis. Electronically Signed   By: Irish LackGlenn  Yamagata M.D.   On: 03/26/2016 18:37    Assessment and Plan: 1. Knee injury, left, initial encounter Reassuring radiographs. Suspect meniscal injury vs anserine bursitis. Non-weight bearing and OTC NSAIDS. Advance weight bearing as tolerated by pain. RTC in 1 week for re-evaluation, sooner if symptoms worsen. - DG Knee Complete 4 Views Left; Future - Crutches   Fernande Brashelle S. Chauntelle Azpeitia, PA-C Physician Assistant-Certified [facility]

## 2016-03-26 NOTE — Patient Instructions (Addendum)
Use the crutches, initially no weight bearing. You can advance your weight bearing as tolerated without pain.     IF you received an x-ray today, you will receive an invoice from [facility]. Please contact [facility] at [phone] with questions or concerns regarding your invoice.   IF you received labwork today, you will receive an invoice from [facility]. Please contact [facility] at [phone] with questions or concerns regarding your invoice.   Our billing staff will not be able to assist you with questions regarding bills from these companies.  You will be contacted with the lab results as soon as they are available. The fastest way to get your results is to activate your My Chart account. Instructions are located on the last page of this paperwork. If you have not heard from us regarding the results in 2 weeks, please contact this office.

## 2016-03-28 ENCOUNTER — Encounter: Payer: Self-pay | Admitting: *Deleted

## 2016-03-28 NOTE — Progress Notes (Signed)
   DOS 07-31-15  Austin bunionectomy right

## 2016-03-30 ENCOUNTER — Encounter: Payer: Self-pay | Admitting: Physician Assistant

## 2016-03-31 ENCOUNTER — Ambulatory Visit (INDEPENDENT_AMBULATORY_CARE_PROVIDER_SITE_OTHER): Payer: Worker's Compensation | Admitting: Physician Assistant

## 2016-03-31 ENCOUNTER — Telehealth: Payer: Self-pay

## 2016-03-31 ENCOUNTER — Encounter: Payer: Self-pay | Admitting: Physician Assistant

## 2016-03-31 VITALS — BP 138/82 | HR 80 | Temp 98.6°F | Resp 18 | Ht 68.0 in | Wt 214.0 lb

## 2016-03-31 DIAGNOSIS — S8992XD Unspecified injury of left lower leg, subsequent encounter: Secondary | ICD-10-CM

## 2016-03-31 DIAGNOSIS — S8992XA Unspecified injury of left lower leg, initial encounter: Secondary | ICD-10-CM

## 2016-03-31 NOTE — Patient Instructions (Addendum)
Continue with the crutches, and only put as much weight on the leg as you can without pain. Continue the anti-inflammatory. The orthopedic office will contact you directly to schedule the visit there.    IF you received an x-ray today, you will receive an invoice from [facility]. Please contact [facility] at [phone] with questions or concerns regarding your invoice.   IF you received labwork today, you will receive an invoice from [facility]. Please contact [facility] at [phone] with questions or concerns regarding your invoice.   Our billing staff will not be able to assist you with questions regarding bills from these companies.  You will be contacted with the lab results as soon as they are available. The fastest way to get your results is to activate your My Chart account. Instructions are located on the last page of this paperwork. If you have not heard from us regarding the results in 2 weeks, please contact this office.

## 2016-03-31 NOTE — Telephone Encounter (Signed)
Patient states he is still having pain in his left knee. He is unable to apply any pressure to it and has been off of it since last Thursday. He feels he needs to be evaluated with an orthopedic specialist and is requesting a referral. He was last seen at our office on 03/26/16 with [name]

## 2016-03-31 NOTE — Progress Notes (Signed)
[name] [birth year] 55 y.o.   Chief Complaint  Patient presents with  . Follow-up    workers comp left knee     Date of Injury: Wednesday 03/26/2016  History of Present Illness:  Presents for evaluation of work-related complaint.  On 03/26/16, the patient developed sudden knee pain, following hearing a pop, as he climbed a flight of stairs at work. He had 8/10 pain with weight bearing and 5/10 pain at rest. The point of maximum tenderness was at the anserine bursa, but there was no swelling, redness or increased warmth there. He had some anterior medial joint line tenderness as well.  Radiographs revealed the chronic changes but no acute bony deformity. He was given crutches and advised to use meloxicam that had previously been prescribed for DJD of his knees.   Since his visit here on 8/23, he's had minimal improvement. The pain is still diffuse, still worst at the medial aspect, just below the patella, in the location of the anserine bursa. Still no swelling, erythema or increased warmth.   Pain at rest in minimal to none.  Pain with weight bearing is 5/10, and with trying to push off that foot, as in stair climbing, the pain increased to 9-10/10. His employer will not let him return to any work until he no longer needs crutches.     ROS As above.    Current medications and allergies reviewed and updated. Past medical history, family history, social history have been reviewed and updated.   Physical Exam  Constitutional: He is well-developed, well-nourished, and in no distress.  Pulmonary/Chest: Effort normal.  Musculoskeletal:       Left knee: He exhibits normal range of motion, no swelling, no effusion, no ecchymosis, no deformity, no laceration, no erythema, normal alignment, no LCL laxity, normal patellar mobility and no MCL laxity. Tenderness found. Medial joint line tenderness noted. No lateral joint line, no MCL, no LCL and no patellar tendon tenderness  noted.       Legs: Skin: Skin is warm and dry.  Psychiatric: Mood, affect and judgment normal.     Assessment and Plan: 1. Knee injury, left, subsequent encounter Tendonitis? Atypical presentation of anserine bursitis? Meniscal tear? Continue meloxicam, non-weight bearing with crutches. Refer to ortho for additional evaluation, expected to include imaging, and treatment. - Ambulatory referral to Orthopedic Surgery   Fernande Brashelle S. Jaxsen Bernhart, PA-C Physician Assistant-Certified [facility]

## 2016-03-31 NOTE — Telephone Encounter (Signed)
k to refer, referral pended.

## 2016-03-31 NOTE — Progress Notes (Signed)
f °

## 2016-04-01 NOTE — Telephone Encounter (Signed)
I saw him for follow-up yesterday, as planned, before receiving this message, and referred him. I have [facility] that [facility] are denying the claim. So, as long as he is OK with it going on his personal insurance, please proceed with the referral. He prefers [facility].

## 2016-04-16 ENCOUNTER — Other Ambulatory Visit: Payer: Self-pay | Admitting: Orthopedic Surgery

## 2016-04-18 ENCOUNTER — Encounter (HOSPITAL_COMMUNITY): Payer: Self-pay | Admitting: *Deleted

## 2016-04-18 NOTE — Progress Notes (Signed)
Pt had a cardiac cath done 2 years ago by Dr. Mendel RyderH. Smith, no blockages noted. Aggressive medical therapy for HTN and cholesterol was the plan. Pt states he is now followed by Dr. Prudy FeelerAngel Jones for this. Pt denies any recent chest pain or sob.

## 2016-04-19 ENCOUNTER — Encounter: Payer: Self-pay | Admitting: Physician Assistant

## 2016-04-19 DIAGNOSIS — M25562 Pain in left knee: Secondary | ICD-10-CM | POA: Insufficient documentation

## 2016-04-21 ENCOUNTER — Ambulatory Visit (HOSPITAL_COMMUNITY): Payer: BLUE CROSS/BLUE SHIELD | Admitting: Anesthesiology

## 2016-04-21 ENCOUNTER — Ambulatory Visit (HOSPITAL_COMMUNITY): Payer: BLUE CROSS/BLUE SHIELD

## 2016-04-21 ENCOUNTER — Ambulatory Visit (HOSPITAL_COMMUNITY)
Admission: RE | Admit: 2016-04-21 | Discharge: 2016-04-21 | Disposition: A | Discharge status: Home / Self Care | Payer: BLUE CROSS/BLUE SHIELD | Source: Ambulatory Visit | Attending: Orthopedic Surgery | Admitting: Orthopedic Surgery

## 2016-04-21 ENCOUNTER — Encounter (HOSPITAL_COMMUNITY): Payer: Self-pay

## 2016-04-21 ENCOUNTER — Encounter (HOSPITAL_COMMUNITY)
Admission: RE | Disposition: A | Discharge status: Home / Self Care | Payer: Self-pay | Source: Ambulatory Visit | Attending: Orthopedic Surgery

## 2016-04-21 DIAGNOSIS — Z885 Allergy status to narcotic agent status: Secondary | ICD-10-CM | POA: Insufficient documentation

## 2016-04-21 DIAGNOSIS — Z8249 Family history of ischemic heart disease and other diseases of the circulatory system: Secondary | ICD-10-CM | POA: Diagnosis not present

## 2016-04-21 DIAGNOSIS — Z833 Family history of diabetes mellitus: Secondary | ICD-10-CM | POA: Insufficient documentation

## 2016-04-21 DIAGNOSIS — I2510 Atherosclerotic heart disease of native coronary artery without angina pectoris: Secondary | ICD-10-CM | POA: Insufficient documentation

## 2016-04-21 DIAGNOSIS — Z419 Encounter for procedure for purposes other than remedying health state, unspecified: Secondary | ICD-10-CM

## 2016-04-21 DIAGNOSIS — M1990 Unspecified osteoarthritis, unspecified site: Secondary | ICD-10-CM | POA: Diagnosis not present

## 2016-04-21 DIAGNOSIS — J309 Allergic rhinitis, unspecified: Secondary | ICD-10-CM | POA: Insufficient documentation

## 2016-04-21 DIAGNOSIS — H5441 Blindness, right eye, normal vision left eye: Secondary | ICD-10-CM | POA: Diagnosis not present

## 2016-04-21 DIAGNOSIS — G4730 Sleep apnea, unspecified: Secondary | ICD-10-CM | POA: Diagnosis not present

## 2016-04-21 DIAGNOSIS — F17220 Nicotine dependence, chewing tobacco, uncomplicated: Secondary | ICD-10-CM | POA: Diagnosis not present

## 2016-04-21 DIAGNOSIS — L659 Nonscarring hair loss, unspecified: Secondary | ICD-10-CM | POA: Diagnosis not present

## 2016-04-21 DIAGNOSIS — H409 Unspecified glaucoma: Secondary | ICD-10-CM | POA: Diagnosis not present

## 2016-04-21 DIAGNOSIS — G43409 Hemiplegic migraine, not intractable, without status migrainosus: Secondary | ICD-10-CM | POA: Diagnosis not present

## 2016-04-21 DIAGNOSIS — X58XXXA Exposure to other specified factors, initial encounter: Secondary | ICD-10-CM | POA: Insufficient documentation

## 2016-04-21 DIAGNOSIS — E785 Hyperlipidemia, unspecified: Secondary | ICD-10-CM | POA: Insufficient documentation

## 2016-04-21 DIAGNOSIS — I10 Essential (primary) hypertension: Secondary | ICD-10-CM | POA: Insufficient documentation

## 2016-04-21 DIAGNOSIS — Z7982 Long term (current) use of aspirin: Secondary | ICD-10-CM | POA: Insufficient documentation

## 2016-04-21 DIAGNOSIS — S83242A Other tear of medial meniscus, current injury, left knee, initial encounter: Secondary | ICD-10-CM | POA: Insufficient documentation

## 2016-04-21 DIAGNOSIS — K219 Gastro-esophageal reflux disease without esophagitis: Secondary | ICD-10-CM | POA: Insufficient documentation

## 2016-04-21 DIAGNOSIS — Z87442 Personal history of urinary calculi: Secondary | ICD-10-CM | POA: Diagnosis not present

## 2016-04-21 HISTORY — PX: KNEE ARTHROSCOPY WITH MENISCAL REPAIR: SHX5653

## 2016-04-21 HISTORY — DX: Sleep apnea, unspecified: G47.30

## 2016-04-21 LAB — BASIC METABOLIC PANEL
ANION GAP: 7 (ref 5–15)
BUN: 11 mg/dL (ref 6–20)
CALCIUM: 9.5 mg/dL (ref 8.9–10.3)
CO2: 26 mmol/L (ref 22–32)
Chloride: 107 mmol/L (ref 101–111)
Creatinine, Ser: 0.83 mg/dL (ref 0.61–1.24)
GFR calc Af Amer: >60 mL/min (ref >60)
GLUCOSE: 94 mg/dL (ref 65–99)
Potassium: 4 mmol/L (ref 3.5–5.1)
SODIUM: 140 mmol/L (ref 135–145)

## 2016-04-21 LAB — CBC
HCT: 46.1 % (ref 39.0–52.0)
Hemoglobin: 15.7 g/dL (ref 13.0–17.0)
MCH: 28.3 pg (ref 26.0–34.0)
MCHC: 34.1 g/dL (ref 30.0–36.0)
MCV: 83.1 fL (ref 78.0–100.0)
Platelets: 226 10*3/uL (ref 150–400)
RBC: 5.55 MIL/uL (ref 4.22–5.81)
RDW: 12.8 % (ref 11.5–15.5)
WBC: 7.7 10*3/uL (ref 4.0–10.5)

## 2016-04-21 SURGERY — ARTHROSCOPY, KNEE, WITH MENISCUS REPAIR
Anesthesia: General | Site: Knee | Laterality: Left

## 2016-04-21 MED ORDER — ONDANSETRON HCL 4 MG/2ML IJ SOLN
INTRAMUSCULAR | Status: DC | PRN
  Administered 2016-04-21: 4 mg via INTRAVENOUS

## 2016-04-21 MED ORDER — 0.9 % SODIUM CHLORIDE (POUR BTL) OPTIME
TOPICAL | Status: DC | PRN
  Administered 2016-04-21: 1000 mL

## 2016-04-21 MED ORDER — DEXTROSE 5 % IV SOLN
INTRAVENOUS | Status: DC | PRN
  Administered 2016-04-21: 15 ug/min via INTRAVENOUS

## 2016-04-21 MED ORDER — ONDANSETRON HCL 4 MG/2ML IJ SOLN
INTRAMUSCULAR | Status: AC
  Filled 2016-04-21: qty 2

## 2016-04-21 MED ORDER — LIDOCAINE 2% (20 MG/ML) 5 ML SYRINGE
INTRAMUSCULAR | Status: AC
  Filled 2016-04-21: qty 5

## 2016-04-21 MED ORDER — ACETAMINOPHEN 10 MG/ML IV SOLN
INTRAVENOUS | Status: DC | PRN
  Administered 2016-04-21: 1000 mg via INTRAVENOUS

## 2016-04-21 MED ORDER — MEPERIDINE HCL 25 MG/ML IJ SOLN: 6.2500 mg | INTRAMUSCULAR | Status: DC | PRN

## 2016-04-21 MED ORDER — LACTATED RINGERS IV SOLN
INTRAVENOUS | Status: DC
  Administered 2016-04-21 (×3): via INTRAVENOUS

## 2016-04-21 MED ORDER — DEXAMETHASONE SODIUM PHOSPHATE 10 MG/ML IJ SOLN
INTRAMUSCULAR | Status: AC
  Filled 2016-04-21: qty 1

## 2016-04-21 MED ORDER — HYDROMORPHONE HCL 1 MG/ML IJ SOLN
INTRAMUSCULAR | Status: AC
  Filled 2016-04-21: qty 1

## 2016-04-21 MED ORDER — FENTANYL CITRATE (PF) 100 MCG/2ML IJ SOLN
INTRAMUSCULAR | Status: DC | PRN
  Administered 2016-04-21 (×2): 25 ug via INTRAVENOUS
  Administered 2016-04-21 (×2): 50 ug via INTRAVENOUS
  Administered 2016-04-21: 100 ug via INTRAVENOUS
  Administered 2016-04-21 (×2): 25 ug via INTRAVENOUS

## 2016-04-21 MED ORDER — FENTANYL CITRATE (PF) 100 MCG/2ML IJ SOLN
INTRAMUSCULAR | Status: AC
  Filled 2016-04-21: qty 2

## 2016-04-21 MED ORDER — PROPOFOL 10 MG/ML IV BOLUS
INTRAVENOUS | Status: AC
  Filled 2016-04-21: qty 20

## 2016-04-21 MED ORDER — KETOROLAC TROMETHAMINE 30 MG/ML IJ SOLN
30.0000 mg | Freq: Once | INTRAMUSCULAR | Status: AC
  Administered 2016-04-21: 30 mg via INTRAVENOUS

## 2016-04-21 MED ORDER — MIDAZOLAM HCL 5 MG/5ML IJ SOLN
INTRAMUSCULAR | Status: DC | PRN
  Administered 2016-04-21: 2 mg via INTRAVENOUS

## 2016-04-21 MED ORDER — MIDAZOLAM HCL 2 MG/2ML IJ SOLN: 0.5000 mg | Freq: Once | INTRAMUSCULAR | Status: DC | PRN

## 2016-04-21 MED ORDER — PROMETHAZINE HCL 25 MG/ML IJ SOLN: 6.2500 mg | INTRAMUSCULAR | Status: DC | PRN

## 2016-04-21 MED ORDER — CLONIDINE HCL (ANALGESIA) 100 MCG/ML EP SOLN
EPIDURAL | Status: DC | PRN
  Administered 2016-04-21: 100 ug via INTRA_ARTICULAR

## 2016-04-21 MED ORDER — HYDROCODONE-ACETAMINOPHEN 5-325 MG PO TABS
2.0000 | ORAL_TABLET | Freq: Once | ORAL | Status: AC
  Administered 2016-04-21: 2 via ORAL

## 2016-04-21 MED ORDER — HYDROCODONE-ACETAMINOPHEN 10-325 MG PO TABS: 1.0000 | ORAL_TABLET | ORAL | 0 refills | Status: DC | PRN

## 2016-04-21 MED ORDER — LIDOCAINE 2% (20 MG/ML) 5 ML SYRINGE
INTRAMUSCULAR | Status: DC | PRN
  Administered 2016-04-21: 20 mg via INTRAVENOUS

## 2016-04-21 MED ORDER — HYDROMORPHONE HCL 1 MG/ML IJ SOLN
0.2500 mg | INTRAMUSCULAR | Status: DC | PRN
  Administered 2016-04-21: 0.25 mg via INTRAVENOUS

## 2016-04-21 MED ORDER — CEFAZOLIN SODIUM-DEXTROSE 2-4 GM/100ML-% IV SOLN
2.0000 g | INTRAVENOUS | Status: AC
  Administered 2016-04-21: 2 g via INTRAVENOUS
  Filled 2016-04-21: qty 100

## 2016-04-21 MED ORDER — GLYCOPYRROLATE 0.2 MG/ML IV SOSY
PREFILLED_SYRINGE | INTRAVENOUS | Status: AC
  Filled 2016-04-21: qty 3

## 2016-04-21 MED ORDER — DEXAMETHASONE SODIUM PHOSPHATE 4 MG/ML IJ SOLN
INTRAMUSCULAR | Status: DC | PRN
  Administered 2016-04-21: 10 mg via INTRAVENOUS

## 2016-04-21 MED ORDER — EPHEDRINE SULFATE 50 MG/ML IJ SOLN
INTRAMUSCULAR | Status: DC | PRN
  Administered 2016-04-21 (×4): 5 mg via INTRAVENOUS
  Administered 2016-04-21: 10 mg via INTRAVENOUS

## 2016-04-21 MED ORDER — BUPIVACAINE-EPINEPHRINE (PF) 0.5% -1:200000 IJ SOLN
INTRAMUSCULAR | Status: DC | PRN
  Administered 2016-04-21: 11 mL via PERINEURAL

## 2016-04-21 MED ORDER — BUPIVACAINE-EPINEPHRINE (PF) 0.25% -1:200000 IJ SOLN
INTRAMUSCULAR | Status: AC
  Filled 2016-04-21: qty 30

## 2016-04-21 MED ORDER — PROPOFOL 10 MG/ML IV BOLUS
INTRAVENOUS | Status: DC | PRN
  Administered 2016-04-21: 200 mg via INTRAVENOUS

## 2016-04-21 MED ORDER — EPINEPHRINE HCL 1 MG/ML IJ SOLN
INTRAMUSCULAR | Status: AC
  Filled 2016-04-21: qty 3

## 2016-04-21 MED ORDER — EPINEPHRINE HCL 1 MG/ML IJ SOLN
INTRAMUSCULAR | Status: AC
  Filled 2016-04-21: qty 1

## 2016-04-21 MED ORDER — CHLORHEXIDINE GLUCONATE 4 % EX LIQD: 60.0000 mL | Freq: Once | CUTANEOUS | Status: DC

## 2016-04-21 MED ORDER — PHENYLEPHRINE HCL 10 MG/ML IJ SOLN
INTRAMUSCULAR | Status: DC | PRN
Start: 2016-04-21 — End: 2016-04-21
  Administered 2016-04-21 (×2): 80 ug via INTRAVENOUS
  Administered 2016-04-21: 40 ug via INTRAVENOUS
  Administered 2016-04-21: 80 ug via INTRAVENOUS
  Administered 2016-04-21: 40 ug via INTRAVENOUS

## 2016-04-21 MED ORDER — PHENYLEPHRINE 40 MCG/ML (10ML) SYRINGE FOR IV PUSH (FOR BLOOD PRESSURE SUPPORT)
PREFILLED_SYRINGE | INTRAVENOUS | Status: AC
  Filled 2016-04-21: qty 10

## 2016-04-21 MED ORDER — KETOROLAC TROMETHAMINE 30 MG/ML IJ SOLN
INTRAMUSCULAR | Status: AC
  Filled 2016-04-21: qty 1

## 2016-04-21 MED ORDER — HYDROCODONE-ACETAMINOPHEN 5-325 MG PO TABS
ORAL_TABLET | ORAL | Status: AC
  Filled 2016-04-21: qty 2

## 2016-04-21 MED ORDER — ACETAMINOPHEN 10 MG/ML IV SOLN
INTRAVENOUS | Status: AC
  Filled 2016-04-21: qty 100

## 2016-04-21 MED ORDER — SODIUM CHLORIDE 0.9 % IR SOLN
Status: DC | PRN
  Administered 2016-04-21 (×4): 3000 mL

## 2016-04-21 MED ORDER — SODIUM CHLORIDE 0.9 % IR SOLN: Status: DC | PRN

## 2016-04-21 MED ORDER — ASPIRIN EC 325 MG PO TBEC: 325.0000 mg | DELAYED_RELEASE_TABLET | Freq: Every day | ORAL | 0 refills | Status: DC

## 2016-04-21 MED ORDER — EPHEDRINE 5 MG/ML INJ
INTRAVENOUS | Status: AC
  Filled 2016-04-21: qty 10

## 2016-04-21 MED ORDER — GLYCOPYRROLATE 0.2 MG/ML IJ SOLN
INTRAMUSCULAR | Status: DC | PRN
  Administered 2016-04-21: 0.2 mg via INTRAVENOUS

## 2016-04-21 SURGICAL SUPPLY — 101 items
ALCOHOL 70% 16 OZ (MISCELLANEOUS) ×2 IMPLANT
ANCH SUT PUSHLCK 19.5X3.5 STRL (Anchor) ×2 IMPLANT
ANCH SUT SWLK 19.1X4.75 VT (Anchor) ×1 IMPLANT
ANCHOR PEEK 4.75X19.1 SWLK C (Anchor) ×1 IMPLANT
ANCHOR PUSHLOCK PEEK 3.5X19.5 (Anchor) ×2 IMPLANT
BANDAGE ACE 6X5 VEL STRL LF (GAUZE/BANDAGES/DRESSINGS) IMPLANT
BANDAGE ELASTIC 6 VELCRO ST LF (GAUZE/BANDAGES/DRESSINGS) ×2 IMPLANT
BANDAGE ESMARK 6X9 LF (GAUZE/BANDAGES/DRESSINGS) IMPLANT
BLADE CUTTER GATOR 3.5 (BLADE) ×2 IMPLANT
BLADE GREAT WHITE 4.2 (BLADE) ×2 IMPLANT
BLADE SURG 10 STRL SS (BLADE) ×2 IMPLANT
BLADE SURG 15 STRL LF DISP TIS (BLADE) ×1 IMPLANT
BLADE SURG 15 STRL SS (BLADE) ×2
BLADE SURG ROTATE 9660 (MISCELLANEOUS) IMPLANT
BNDG CMPR 9X6 STRL LF SNTH (GAUZE/BANDAGES/DRESSINGS)
BNDG ESMARK 6X9 LF (GAUZE/BANDAGES/DRESSINGS)
BUR OVAL 4.0 (BURR) ×1 IMPLANT
BUR OVAL 6.0 (BURR) IMPLANT
CANNULA 5.75X71 LONG (CANNULA) ×1 IMPLANT
COVER MAYO STAND STRL (DRAPES) ×2 IMPLANT
COVER SURGICAL LIGHT HANDLE (MISCELLANEOUS) ×2 IMPLANT
CUFF TOURNIQUET SINGLE 34IN LL (TOURNIQUET CUFF) IMPLANT
CUFF TOURNIQUET SINGLE 44IN (TOURNIQUET CUFF) IMPLANT
DRAPE ARTHROSCOPY W/POUCH 114 (DRAPES) ×2 IMPLANT
DRAPE C-ARM 42X72 X-RAY (DRAPES) ×1 IMPLANT
DRAPE INCISE IOBAN 66X45 STRL (DRAPES) IMPLANT
DRAPE PROXIMA HALF (DRAPES) IMPLANT
DRAPE U-SHAPE 47X51 STRL (DRAPES) ×2 IMPLANT
DRILL FLIPCUTTER II 7.0MM (INSTRUMENTS) IMPLANT
DRSG TEGADERM 4X4.75 (GAUZE/BANDAGES/DRESSINGS) ×3 IMPLANT
DURAPREP 26ML APPLICATOR (WOUND CARE) ×2 IMPLANT
ELECT REM PT RETURN 9FT ADLT (ELECTROSURGICAL) ×2
ELECTRODE REM PT RTRN 9FT ADLT (ELECTROSURGICAL) ×1 IMPLANT
FIBERLOOP 2 0 (SUTURE) ×2 IMPLANT
FIBERSTICK 2 (SUTURE) ×1 IMPLANT
FLIP CUTTER II 7.0MM (INSTRUMENTS) ×2
GAUZE SPONGE 4X4 12PLY STRL (GAUZE/BANDAGES/DRESSINGS) ×1 IMPLANT
GAUZE XEROFORM 1X8 LF (GAUZE/BANDAGES/DRESSINGS) ×1 IMPLANT
GLOVE BIOGEL PI IND STRL 7.0 (GLOVE) IMPLANT
GLOVE BIOGEL PI IND STRL 7.5 (GLOVE) ×1 IMPLANT
GLOVE BIOGEL PI IND STRL 8 (GLOVE) ×1 IMPLANT
GLOVE BIOGEL PI INDICATOR 7.0 (GLOVE) ×2
GLOVE BIOGEL PI INDICATOR 7.5 (GLOVE) ×2
GLOVE BIOGEL PI INDICATOR 8 (GLOVE) ×1
GLOVE ECLIPSE 6.5 STRL STRAW (GLOVE) ×1 IMPLANT
GLOVE ECLIPSE 7.0 STRL STRAW (GLOVE) ×2 IMPLANT
GLOVE SURG ORTHO 8.0 STRL STRW (GLOVE) ×2 IMPLANT
GLOVE SURG SS PI 7.0 STRL IVOR (GLOVE) ×3 IMPLANT
GOWN STRL REUS W/ TWL LRG LVL3 (GOWN DISPOSABLE) ×2 IMPLANT
GOWN STRL REUS W/ TWL XL LVL3 (GOWN DISPOSABLE) ×1 IMPLANT
GOWN STRL REUS W/TWL LRG LVL3 (GOWN DISPOSABLE) ×10
GOWN STRL REUS W/TWL XL LVL3 (GOWN DISPOSABLE) ×2
IMMOBILIZER KNEE 24 THIGH 36 (MISCELLANEOUS) IMPLANT
IMMOBILIZER KNEE 24 UNIV (MISCELLANEOUS) ×2
KIT BASIN OR (CUSTOM PROCEDURE TRAY) ×2 IMPLANT
KIT ROOM TURNOVER OR (KITS) ×2 IMPLANT
MANIFOLD NEPTUNE II (INSTRUMENTS) IMPLANT
NDL 18GX1X1/2 (RX/OR ONLY) (NEEDLE) IMPLANT
NDL FILTER BLUNT 18X1 1/2 (NEEDLE) IMPLANT
NDL HYPO 25GX1X1/2 BEV (NEEDLE) ×1 IMPLANT
NDL SCORPION MULTI FIRE (NEEDLE) IMPLANT
NDL SUT 2-0 SCORPION KNEE (NEEDLE) IMPLANT
NEEDLE 18GX1X1/2 (RX/OR ONLY) (NEEDLE) ×4 IMPLANT
NEEDLE FILTER BLUNT 18X 1/2SAF (NEEDLE) ×1
NEEDLE FILTER BLUNT 18X1 1/2 (NEEDLE) ×1 IMPLANT
NEEDLE HYPO 25GX1X1/2 BEV (NEEDLE) ×2 IMPLANT
NEEDLE SCORPION MULTI FIRE (NEEDLE) ×4 IMPLANT
NEEDLE SUT 2-0 SCORPION KNEE (NEEDLE) ×4 IMPLANT
NS IRRIG 1000ML POUR BTL (IV SOLUTION) ×2 IMPLANT
PACK ARTHROSCOPY DSU (CUSTOM PROCEDURE TRAY) ×2 IMPLANT
PAD ARMBOARD 7.5X6 YLW CONV (MISCELLANEOUS) ×4 IMPLANT
PADDING CAST COTTON 6X4 STRL (CAST SUPPLIES) ×3 IMPLANT
PENCIL BUTTON HOLSTER BLD 10FT (ELECTRODE) ×2 IMPLANT
RETRIEVER SUT HEWSON (MISCELLANEOUS) ×1 IMPLANT
SET ARTHROSCOPY TUBING (MISCELLANEOUS) ×2
SET ARTHROSCOPY TUBING LN (MISCELLANEOUS) ×1 IMPLANT
SOL PREP POV-IOD 4OZ 10% (MISCELLANEOUS) ×2 IMPLANT
SPONGE LAP 18X18 X RAY DECT (DISPOSABLE) ×1 IMPLANT
SPONGE LAP 4X18 X RAY DECT (DISPOSABLE) ×2 IMPLANT
SUCTION FRAZIER HANDLE 10FR (MISCELLANEOUS) ×1
SUCTION TUBE FRAZIER 10FR DISP (MISCELLANEOUS) IMPLANT
SUT ETHILON 3 0 FSL (SUTURE) ×4 IMPLANT
SUT ETHILON 3 0 PS 1 (SUTURE) IMPLANT
SUT FIBERWIRE #2 38 T-5 BLUE (SUTURE) ×6
SUT FIBERWIRE 2-0 18 17.9 3/8 (SUTURE) ×4
SUT MENISCAL KIT (KITS) ×1 IMPLANT
SUT VIC AB 0 CT1 27 (SUTURE) ×2
SUT VIC AB 0 CT1 27XBRD ANBCTR (SUTURE) IMPLANT
SUT VIC AB 2-0 CT1 27 (SUTURE) ×4
SUT VIC AB 2-0 CT1 TAPERPNT 27 (SUTURE) IMPLANT
SUTURE FIBERWR #2 38 T-5 BLUE (SUTURE) ×3 IMPLANT
SUTURE FIBERWR 2-0 18 17.9 3/8 (SUTURE) IMPLANT
SYR 20ML ECCENTRIC (SYRINGE) ×2 IMPLANT
SYR 5ML LL (SYRINGE) ×2 IMPLANT
SYR CONTROL 10ML LL (SYRINGE) IMPLANT
SYR TB 1ML LUER SLIP (SYRINGE) ×2 IMPLANT
TOWEL OR 17X24 6PK STRL BLUE (TOWEL DISPOSABLE) ×2 IMPLANT
TOWEL OR 17X26 10 PK STRL BLUE (TOWEL DISPOSABLE) ×2 IMPLANT
TUBE CONNECTING 12X1/4 (SUCTIONS) ×2 IMPLANT
WAND HAND CNTRL MULTIVAC 90 (MISCELLANEOUS) ×1 IMPLANT
WATER STERILE IRR 1000ML POUR (IV SOLUTION) ×2 IMPLANT
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
## 2016-04-21 NOTE — Anesthesia Postprocedure Evaluation (Signed)
Anesthesia Post Note  Patient: [name]  Procedure(s) Performed: Procedure(s) (LRB): KNEE ARTHROSCOPY WITH MENISCAL ROOT REPAIR VERSUS DEBRIDEMENT  (Left)  Patient location during evaluation: PACU Anesthesia Type: General Level of consciousness: awake and alert, oriented and patient cooperative Pain management: pain level controlled (pain improving) Vital Signs Assessment: post-procedure vital signs reviewed and stable Respiratory status: spontaneous breathing, nonlabored ventilation and respiratory function stable Cardiovascular status: blood pressure returned to baseline and stable Postop Assessment: no signs of nausea or vomiting Anesthetic complications: no    Last Vitals:  Vitals:   04/21/16 1800 04/21/16 1815  BP: 112/71 128/86  Pulse: 83 96  Resp: 15 10  Temp: 36.1 C     Last Pain:  Vitals:   04/21/16 1800  TempSrc:   PainSc: 10-Worst pain ever                 [name],E. Erienne Spelman

## 2016-04-21 NOTE — Anesthesia Preprocedure Evaluation (Addendum)
Anesthesia Evaluation  Patient identified by MRN, date of birth, ID band Patient awake    Reviewed: Allergy & Precautions, NPO status , Patient's Chart, lab work & pertinent test results  History of Anesthesia Complications Negative for: history of anesthetic complications  Airway Mallampati: II  TM Distance: >3 FB Neck ROM: Full    Dental  (+) Dental Advisory Given   Pulmonary sleep apnea (mild, no CPAP) , COPD,  COPD inhaler,    breath sounds clear to auscultation       Cardiovascular hypertension, Pt. on medications and Pt. on home beta blockers (-) angina(-) CAD  Rhythm:Regular Rate:Normal  '15 ECHO: EF 50-55%, valves OK 02/2014 Cath: minor irregularities and calcification throughout, EF 50%.   Neuro/Psych  Headaches,    GI/Hepatic Neg liver ROS, GERD  Medicated and Controlled,  Endo/Other  Morbid obesity  Renal/GU negative Renal ROS     Musculoskeletal  (+) Arthritis ,   Abdominal (+) + obese,   Peds  Hematology negative hematology ROS (+)   Anesthesia Other Findings   Reproductive/Obstetrics                            Anesthesia Physical Anesthesia Plan  ASA: III  Anesthesia Plan: General   Post-op Pain Management:    Induction: Intravenous  Airway Management Planned: LMA  Additional Equipment:   Intra-op Plan:   Post-operative Plan:   Informed Consent: I have reviewed the patients History and Physical, chart, labs and discussed the procedure including the risks, benefits and alternatives for the proposed anesthesia with the patient or authorized representative who has indicated his/her understanding and acceptance.   Dental advisory given  Plan Discussed with: CRNA and Surgeon  Anesthesia Plan Comments: (Plan routine monitors, GA- LMA OK)       Anesthesia Quick Evaluation

## 2016-04-21 NOTE — Transfer of Care (Signed)
Immediate Anesthesia Transfer of Care Note  Patient: Lawrence MalteseGregory A Hankinson  Procedure(s) Performed: Procedure(s): KNEE ARTHROSCOPY WITH MENISCAL ROOT REPAIR VERSUS DEBRIDEMENT  (Left)  Patient Location: PACU  Anesthesia Type:General  Level of Consciousness: awake  Airway & Oxygen Therapy: Patient Spontanous Breathing and Patient connected to face mask oxygen  Post-op Assessment: Report given to RN, Post -op Vital signs reviewed and stable and Patient moving all extremities X 4  Post vital signs: Reviewed and stable  Last Vitals:  Vitals:   04/21/16 1003  BP: (!) 160/86  Pulse: (!) 57  Resp: 18  Temp: 36.6 C    Last Pain:  Vitals:   04/21/16 1003  TempSrc: Oral         Complications: No apparent anesthesia complications

## 2016-04-21 NOTE — Progress Notes (Signed)
Pt c/ o  10/10 left knee pain, sleeps when left alone. Dr Jean RosenthalJackson fully updated-new orders for IV Toradol  & po Norco.

## 2016-04-21 NOTE — Anesthesia Procedure Notes (Signed)
Procedure Name: LMA Insertion Date/Time: 04/21/2016 1:10 PM Performed by: Doyce LooseHOFFMAN, Mertis Mosher ANN Pre-anesthesia Checklist: Patient identified, Emergency Drugs available, Suction available and Patient being monitored Patient Re-evaluated:Patient Re-evaluated prior to inductionOxygen Delivery Method: Circle System Utilized Preoxygenation: Pre-oxygenation with 100% oxygen Intubation Type: IV induction Ventilation: Mask ventilation without difficulty LMA: LMA inserted LMA Size: 5.0 Number of attempts: 1 Airway Equipment and Method: Bite block Placement Confirmation: positive ETCO2 Tube secured with: Tape Dental Injury: Teeth and Oropharynx as per pre-operative assessment

## 2016-04-21 NOTE — Brief Op Note (Signed)
04/21/2016  4:44 PM  PATIENT:  Lawrence MalteseGregory A Crook  55 y.o. male  PRE-OPERATIVE DIAGNOSIS:  Left Knee Meniscal Root Tear  POST-OPERATIVE DIAGNOSIS:  Left Knee Meniscal Root Tear  PROCEDURE:  Procedure(s): KNEE ARTHROSCOPY WITH MENISCAL ROOT REPAIR    SURGEON:  Surgeon(s): Cammy CopaScott Dekari Tonee Silverstein, MD  ASSISTANT: carla bethune and April green rnfa  ANESTHESIA:   general  EBL: 10 ml    Total I/O In: 1500 [I.V.:1500] Out: 50 [Blood:50]  BLOOD ADMINISTERED: none  DRAINS: none   LOCAL MEDICATIONS USED:  Marcaine clonodine SPECIMEN:  No Specimen  COUNTS:  YES  TOURNIQUET:    DICTATION: .Other Dictation: Dictation Number done  PLAN OF CARE: Discharge to home after PACU  PATIENT DISPOSITION:  PACU - hemodynamically stable

## 2016-04-22 NOTE — Op Note (Signed)
[name], [name] NO.:  [id number]  MEDICAL RECORD NO.:  [id number]  LOCATION:  [facility]                         FACILITY:  [facility]  PHYSICIAN:  Burnard Bunting, M.D.    DATE OF BIRTH:  [birth year]  DATE OF PROCEDURE: DATE OF DISCHARGE:  04/21/2016                              OPERATIVE REPORT   PREOPERATIVE DIAGNOSIS:  Left knee meniscal root tear.  POSTOPERATIVE DIAGNOSIS:  Left knee meniscal root tear.  PROCEDURE:  Left knee meniscal root repair.  SURGEON:  Burnard Bunting, M.D.  ASSISTANT:  Patrick Jupiter and April Chilton Si, RNFA  INDICATIONS:  [name] is a 55 year old patient, left knee pain, presents for operative management after explanation of risks and benefits.  PROCEDURE IN DETAIL:  The patient was brought to the operating room where general anesthetic was induced.  Preoperative antibiotics were administered.  Time-out was called.  Left leg was prescrubbed with alcohol and Betadine, allowed to air dry, prepped with DuraPrep solution and draped in a sterile manner.  Examination under anesthesia demonstrated about 5 degrees lacking for full extension and full flexion, collateral cruciate stable, no posterolateral rotatory instability was noted.  Following exam, prepping, draping and calling of the time-out, the portals were anesthetized with Marcaine with epinephrine.  The skin and subcutaneous tissue were anesthetized down to the capsule.  Anterior inferolateral portal was established.  Anterior inferomedial portals were established under direct visualization. Diagnostic arthroscopy was performed.  The patient did have some grade 2- 3 changes on the undersurface of the patella at the junction of the medial and lateral facets as well as from the trochlea.  Lateral compartment was completely intact.  Medial compartment demonstrated early grade 2 changes over about 30% weightbearing surface area of the medial femoral condyle with an avulsion of the meniscal  root.  At this time, debridement of the fat pad was performed.  Soft tissue was partially resected in order to facilitate visualization posteriorly. Posterior medial portal was created to assist with suture management. At this time, the knee scorpion was utilized; however, outside of the body, one of the needle was broke and this took about an hour to fix. In the meantime, an attempt was made to find the second knee scorpion, which was not fruitful, during the time that the scorpion needle was removed from the scorpion itself and attempt was made to pass sutures through that posteromedial portal; however, the correct angle could not be obtained.  Once the knee scorpion was fixed, two sutures were placed through the meniscal body.  Under fluoroscopic guidance, the flip cutter 7-mm was utilized to drill back into the area of the tibial plateau near the footprint.  The sutures were then passed with the fiber stick down through the 8-mm pothole.  At this time, an attempt was made x2 to secure these sutures using PushLock; however, because of his bone quality, we ended up using a SwiveLock.  This gave good fixation of the meniscal root repair.  The knee was kept in extension because with flexion, there was little bit more pressure on that repair.  We will keep him in extension for about 3 weeks before beginning range of motion.  At this time, good  fixation was achieved.  All incisions were irrigated and closed using 2-0 Vicryl and 3-0 nylon.  Solution of Marcaine and clonidine injected to the knee. Bulky dressing and knee immobilizer placed.  The patient tolerated the procedure well without immediate complication, transferred to the recovery room in stable condition.     Burnard BuntingG. Scott Syniyah Bourne, M.D.     GSD/MEDQ  D:  04/21/2016  T:  04/22/2016  Job:  [id number]

## 2016-04-25 ENCOUNTER — Encounter (HOSPITAL_COMMUNITY): Payer: Self-pay | Admitting: Orthopedic Surgery

## 2016-05-12 ENCOUNTER — Ambulatory Visit (INDEPENDENT_AMBULATORY_CARE_PROVIDER_SITE_OTHER): Payer: Self-pay | Admitting: Orthopedic Surgery

## 2016-05-14 ENCOUNTER — Ambulatory Visit (INDEPENDENT_AMBULATORY_CARE_PROVIDER_SITE_OTHER): Payer: BLUE CROSS/BLUE SHIELD | Admitting: Orthopedic Surgery

## 2016-05-14 DIAGNOSIS — S83242D Other tear of medial meniscus, current injury, left knee, subsequent encounter: Secondary | ICD-10-CM

## 2016-05-20 ENCOUNTER — Telehealth: Payer: Self-pay | Admitting: Physician Assistant

## 2016-05-20 NOTE — Telephone Encounter (Signed)
Spoke with pt regarding bp Elevated bp readings per pt Denies CP or SOB Pt has currently added Naprosyn Per Angel, dc Naprosyn, continue bp meds, drink plenty of H2O, appt scheduled for 05/23/2016. Go to nearest ED with any CP or SOB Pt verbalizes understanding

## 2016-05-23 ENCOUNTER — Encounter: Payer: Self-pay | Admitting: Physician Assistant

## 2016-05-23 ENCOUNTER — Ambulatory Visit (INDEPENDENT_AMBULATORY_CARE_PROVIDER_SITE_OTHER): Payer: BLUE CROSS/BLUE SHIELD | Admitting: Physician Assistant

## 2016-05-23 VITALS — BP 138/101 | HR 76 | Ht 68.0 in | Wt 223.8 lb

## 2016-05-23 DIAGNOSIS — M25562 Pain in left knee: Secondary | ICD-10-CM | POA: Diagnosis not present

## 2016-05-23 DIAGNOSIS — I2510 Atherosclerotic heart disease of native coronary artery without angina pectoris: Secondary | ICD-10-CM

## 2016-05-23 DIAGNOSIS — G8929 Other chronic pain: Secondary | ICD-10-CM | POA: Diagnosis not present

## 2016-05-23 DIAGNOSIS — E785 Hyperlipidemia, unspecified: Secondary | ICD-10-CM | POA: Diagnosis not present

## 2016-05-23 DIAGNOSIS — Z125 Encounter for screening for malignant neoplasm of prostate: Secondary | ICD-10-CM

## 2016-05-23 DIAGNOSIS — I10 Essential (primary) hypertension: Secondary | ICD-10-CM | POA: Diagnosis not present

## 2016-05-23 DIAGNOSIS — S83282S Other tear of lateral meniscus, current injury, left knee, sequela: Secondary | ICD-10-CM

## 2016-05-23 MED ORDER — AMLODIPINE BESYLATE 5 MG PO TABS: 5.0000 mg | ORAL_TABLET | Freq: Every day | ORAL | 3 refills | Status: DC

## 2016-05-23 NOTE — Patient Instructions (Signed)
Hypertension Hypertension, commonly called high blood pressure, is when the force of blood pumping through your arteries is too strong. Your arteries are the blood vessels that carry blood from your heart throughout your body. A blood pressure reading consists of a higher number over a lower number, such as 110/72. The higher number (systolic) is the pressure inside your arteries when your heart pumps. The lower number (diastolic) is the pressure inside your arteries when your heart relaxes. Ideally you want your blood pressure below 120/80. Hypertension forces your heart to work harder to pump blood. Your arteries may become narrow or stiff. Having untreated or uncontrolled hypertension can cause heart attack, stroke, kidney disease, and other problems. RISK FACTORS Some risk factors for high blood pressure are controllable. Others are not.  Risk factors you cannot control include:   Race. You may be at higher risk if you are African American.  Age. Risk increases with age.  Gender. Men are at higher risk than women before age 45 years. After age 65, women are at higher risk than men. Risk factors you can control include:  Not getting enough exercise or physical activity.  Being overweight.  Getting too much fat, sugar, calories, or salt in your diet.  Drinking too much alcohol. SIGNS AND SYMPTOMS Hypertension does not usually cause signs or symptoms. Extremely high blood pressure (hypertensive crisis) may cause headache, anxiety, shortness of breath, and nosebleed. DIAGNOSIS To check if you have hypertension, your health care provider will measure your blood pressure while you are seated, with your arm held at the level of your heart. It should be measured at least twice using the same arm. Certain conditions can cause a difference in blood pressure between your right and left arms. A blood pressure reading that is higher than normal on one occasion does not mean that you need treatment. If  it is not clear whether you have high blood pressure, you may be asked to return on a different day to have your blood pressure checked again. Or, you may be asked to monitor your blood pressure at home for 1 or more weeks. TREATMENT Treating high blood pressure includes making lifestyle changes and possibly taking medicine. Living a healthy lifestyle can help lower high blood pressure. You may need to change some of your habits. Lifestyle changes may include:  Following the DASH diet. This diet is high in fruits, vegetables, and whole grains. It is low in salt, red meat, and added sugars.  Keep your sodium intake below 2,300 mg per day.  Getting at least 30-45 minutes of aerobic exercise at least 4 times per week.  Losing weight if necessary.  Not smoking.  Limiting alcoholic beverages.  Learning ways to reduce stress. Your health care provider may prescribe medicine if lifestyle changes are not enough to get your blood pressure under control, and if one of the following is true:  You are 18-59 years of age and your systolic blood pressure is above 140.  You are 60 years of age or older, and your systolic blood pressure is above 150.  Your diastolic blood pressure is above 90.  You have diabetes, and your systolic blood pressure is over 140 or your diastolic blood pressure is over 90.  You have kidney disease and your blood pressure is above 140/90.  You have heart disease and your blood pressure is above 140/90. Your personal target blood pressure may vary depending on your medical conditions, your age, and other factors. HOME CARE INSTRUCTIONS    Have your blood pressure rechecked as directed by your health care provider.   Take medicines only as directed by your health care provider. Follow the directions carefully. Blood pressure medicines must be taken as prescribed. The medicine does not work as well when you skip doses. Skipping doses also puts you at risk for  problems.  Do not smoke.   Monitor your blood pressure at home as directed by your health care provider. SEEK MEDICAL CARE IF:   You think you are having a reaction to medicines taken.  You have recurrent headaches or feel dizzy.  You have swelling in your ankles.  You have trouble with your vision. SEEK IMMEDIATE MEDICAL CARE IF:  You develop a severe headache or confusion.  You have unusual weakness, numbness, or feel faint.  You have severe chest or abdominal pain.  You vomit repeatedly.  You have trouble breathing. MAKE SURE YOU:   Understand these instructions.  Will watch your condition.  Will get help right away if you are not doing well or get worse.   This information is not intended to replace advice given to you by your health care provider. Make sure you discuss any questions you have with your health care provider.   Document Released: 07/21/2005 Document Revised: 12/05/2014 Document Reviewed: 05/13/2013 Elsevier Interactive Patient Education 2016 [employer]  

## 2016-05-26 DIAGNOSIS — Z125 Encounter for screening for malignant neoplasm of prostate: Secondary | ICD-10-CM | POA: Insufficient documentation

## 2016-05-26 DIAGNOSIS — S83282A Other tear of lateral meniscus, current injury, left knee, initial encounter: Secondary | ICD-10-CM | POA: Insufficient documentation
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
## 2016-05-27 ENCOUNTER — Other Ambulatory Visit (INDEPENDENT_AMBULATORY_CARE_PROVIDER_SITE_OTHER): Payer: Self-pay | Admitting: Orthopaedic Surgery

## 2016-05-27 DIAGNOSIS — S83282S Other tear of lateral meniscus, current injury, left knee, sequela: Secondary | ICD-10-CM

## 2016-05-28 NOTE — Telephone Encounter (Signed)
Ok to refill 

## 2016-05-29 ENCOUNTER — Ambulatory Visit (INDEPENDENT_AMBULATORY_CARE_PROVIDER_SITE_OTHER): Payer: Self-pay | Admitting: Orthopedic Surgery

## 2016-05-29 ENCOUNTER — Ambulatory Visit (INDEPENDENT_AMBULATORY_CARE_PROVIDER_SITE_OTHER): Payer: BLUE CROSS/BLUE SHIELD | Admitting: Physician Assistant

## 2016-05-29 ENCOUNTER — Encounter (INDEPENDENT_AMBULATORY_CARE_PROVIDER_SITE_OTHER): Payer: Self-pay | Admitting: Physician Assistant

## 2016-05-29 DIAGNOSIS — T814XXD Infection following a procedure, subsequent encounter: Secondary | ICD-10-CM

## 2016-05-29 DIAGNOSIS — T8140XD Infection following a procedure, unspecified, subsequent encounter: Secondary | ICD-10-CM

## 2016-05-29 DIAGNOSIS — S83242D Other tear of medial meniscus, current injury, left knee, subsequent encounter: Secondary | ICD-10-CM | POA: Insufficient documentation

## 2016-05-29 MED ORDER — DOXYCYCLINE HYCLATE 100 MG PO TABS: 100.0000 mg | ORAL_TABLET | Freq: Two times a day (BID) | ORAL | Status: DC

## 2016-05-29 MED ORDER — MUPIROCIN 2 % EX OINT: 1.0000 "application " | TOPICAL_OINTMENT | Freq: Every day | CUTANEOUS | 0 refills | Status: DC
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
## 2016-06-02 ENCOUNTER — Telehealth (INDEPENDENT_AMBULATORY_CARE_PROVIDER_SITE_OTHER): Payer: Self-pay | Admitting: *Deleted

## 2016-06-02 NOTE — Telephone Encounter (Signed)
Pt. Called stated he needed a 1 weel F/U appt. Around 11/2.

## 2016-06-03 NOTE — Telephone Encounter (Signed)
IC pt and sched appt 145 on 11/2

## 2016-06-04 ENCOUNTER — Other Ambulatory Visit (INDEPENDENT_AMBULATORY_CARE_PROVIDER_SITE_OTHER): Payer: Self-pay | Admitting: Physician Assistant

## 2016-06-04 MED ORDER — DOXYCYCLINE HYCLATE 100 MG PO TABS: 100.0000 mg | ORAL_TABLET | Freq: Two times a day (BID) | ORAL | 0 refills | Status: DC

## 2016-06-04 MED ORDER — DOXYCYCLINE HYCLATE 100 MG PO TABS: 100.0000 mg | ORAL_TABLET | Freq: Two times a day (BID) | ORAL | Status: DC

## 2016-06-05 ENCOUNTER — Encounter (INDEPENDENT_AMBULATORY_CARE_PROVIDER_SITE_OTHER): Payer: Self-pay | Admitting: Orthopedic Surgery

## 2016-06-05 ENCOUNTER — Ambulatory Visit (INDEPENDENT_AMBULATORY_CARE_PROVIDER_SITE_OTHER): Payer: BLUE CROSS/BLUE SHIELD | Admitting: Orthopedic Surgery

## 2016-06-05 DIAGNOSIS — S83242D Other tear of medial meniscus, current injury, left knee, subsequent encounter: Secondary | ICD-10-CM
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
## 2016-06-09 ENCOUNTER — Other Ambulatory Visit: Payer: Self-pay | Admitting: Physician Assistant

## 2016-06-11 ENCOUNTER — Other Ambulatory Visit: Payer: BLUE CROSS/BLUE SHIELD

## 2016-06-11 DIAGNOSIS — I2510 Atherosclerotic heart disease of native coronary artery without angina pectoris: Secondary | ICD-10-CM

## 2016-06-11 DIAGNOSIS — E785 Hyperlipidemia, unspecified: Secondary | ICD-10-CM

## 2016-06-11 DIAGNOSIS — Z125 Encounter for screening for malignant neoplasm of prostate: Secondary | ICD-10-CM

## 2016-06-11 DIAGNOSIS — I10 Essential (primary) hypertension: Secondary | ICD-10-CM

## 2016-06-12 LAB — CMP14+EGFR
ALBUMIN: 4.2 g/dL (ref 3.5–5.5)
ALT: 28 IU/L (ref 0–44)
AST: 25 IU/L (ref 0–40)
Albumin/Globulin Ratio: 1.8 (ref 1.2–2.2)
Alkaline Phosphatase: 90 IU/L (ref 39–117)
BUN/Creatinine Ratio: 20 (ref 9–20)
BUN: 16 mg/dL (ref 6–24)
Bilirubin Total: 0.6 mg/dL (ref 0.0–1.2)
CALCIUM: 9.7 mg/dL (ref 8.7–10.2)
CO2: 27 mmol/L (ref 18–29)
CREATININE: 0.82 mg/dL (ref 0.76–1.27)
Chloride: 100 mmol/L (ref 96–106)
GFR calc Af Amer: 118 mL/min/{1.73_m2} (ref >59)
GFR, EST NON AFRICAN AMERICAN: 102 mL/min/{1.73_m2} (ref >59)
GLOBULIN, TOTAL: 2.3 g/dL (ref 1.5–4.5)
Glucose: 92 mg/dL (ref 65–99)
Potassium: 4.3 mmol/L (ref 3.5–5.2)
SODIUM: 140 mmol/L (ref 134–144)
Total Protein: 6.5 g/dL (ref 6.0–8.5)

## 2016-06-12 LAB — CBC WITH DIFFERENTIAL/PLATELET
BASOS: 0 %
Basophils Absolute: 0 10*3/uL (ref 0.0–0.2)
EOS (ABSOLUTE): 0.5 10*3/uL — ABNORMAL HIGH (ref 0.0–0.4)
EOS: 7 %
HEMATOCRIT: 43.9 % (ref 37.5–51.0)
HEMOGLOBIN: 14.9 g/dL (ref 12.6–17.7)
IMMATURE GRANS (ABS): 0.1 10*3/uL (ref 0.0–0.1)
IMMATURE GRANULOCYTES: 1 %
Lymphocytes Absolute: 2.4 10*3/uL (ref 0.7–3.1)
Lymphs: 34 %
MCH: 28.1 pg (ref 26.6–33.0)
MCHC: 33.9 g/dL (ref 31.5–35.7)
MCV: 83 fL (ref 79–97)
MONOCYTES: 7 %
MONOS ABS: 0.5 10*3/uL (ref 0.1–0.9)
NEUTROS PCT: 51 %
Neutrophils Absolute: 3.5 10*3/uL (ref 1.4–7.0)
Platelets: 265 10*3/uL (ref 150–379)
RBC: 5.31 x10E6/uL (ref 4.14–5.80)
RDW: 13.9 % (ref 12.3–15.4)
WBC: 6.9 10*3/uL (ref 3.4–10.8)

## 2016-06-12 LAB — LIPID PANEL
CHOL/HDL RATIO: 5 ratio (ref 0.0–5.0)
Cholesterol, Total: 216 mg/dL — ABNORMAL HIGH (ref 100–199)
HDL: 43 mg/dL (ref >39)
LDL CALC: 139 mg/dL — AB (ref 0–99)
Triglycerides: 168 mg/dL — ABNORMAL HIGH (ref 0–149)
VLDL CHOLESTEROL CAL: 34 mg/dL (ref 5–40)

## 2016-06-12 LAB — PSA: PROSTATE SPECIFIC AG, SERUM: 0.8 ng/mL (ref 0.0–4.0)

## 2016-06-16 ENCOUNTER — Ambulatory Visit (INDEPENDENT_AMBULATORY_CARE_PROVIDER_SITE_OTHER): Payer: BLUE CROSS/BLUE SHIELD | Admitting: Orthopedic Surgery

## 2016-06-16 ENCOUNTER — Encounter (INDEPENDENT_AMBULATORY_CARE_PROVIDER_SITE_OTHER): Payer: Self-pay | Admitting: Orthopedic Surgery

## 2016-06-16 DIAGNOSIS — S83242D Other tear of medial meniscus, current injury, left knee, subsequent encounter: Secondary | ICD-10-CM
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
## 2016-06-17 ENCOUNTER — Ambulatory Visit (INDEPENDENT_AMBULATORY_CARE_PROVIDER_SITE_OTHER): Payer: BLUE CROSS/BLUE SHIELD | Admitting: Physician Assistant

## 2016-06-17 ENCOUNTER — Encounter: Payer: Self-pay | Admitting: Physician Assistant

## 2016-06-17 VITALS — BP 130/88 | HR 81 | Temp 97.2°F | Ht 68.0 in | Wt 227.6 lb

## 2016-06-17 DIAGNOSIS — N5201 Erectile dysfunction due to arterial insufficiency: Secondary | ICD-10-CM

## 2016-06-17 DIAGNOSIS — M25572 Pain in left ankle and joints of left foot: Secondary | ICD-10-CM | POA: Diagnosis not present

## 2016-06-17 DIAGNOSIS — I10 Essential (primary) hypertension: Secondary | ICD-10-CM | POA: Diagnosis not present

## 2016-06-17 MED ORDER — HYDROCHLOROTHIAZIDE 25 MG PO TABS: 25.0000 mg | ORAL_TABLET | Freq: Every day | ORAL | 3 refills | Status: DC

## 2016-06-17 MED ORDER — ASPIRIN EC 81 MG PO TBEC: 81.0000 mg | DELAYED_RELEASE_TABLET | Freq: Every day | ORAL | 3 refills | Status: DC

## 2016-06-17 MED ORDER — TADALAFIL 20 MG PO TABS: 10.0000 mg | ORAL_TABLET | ORAL | 11 refills | Status: DC | PRN

## 2016-06-17 MED ORDER — DESLORATADINE 5 MG PO TABS: 5.0000 mg | ORAL_TABLET | Freq: Every day | ORAL | 3 refills | Status: DC
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
## 2016-06-17 NOTE — Patient Instructions (Signed)
DASH Eating Plan DASH stands for "Dietary Approaches to Stop Hypertension." The DASH eating plan is a healthy eating plan that has been shown to reduce high blood pressure (hypertension). Additional health benefits may include reducing the risk of type 2 diabetes mellitus, heart disease, and stroke. The DASH eating plan may also help with weight loss. What do I need to know about the DASH eating plan? For the DASH eating plan, you will follow these general guidelines:  Choose foods with less than 150 milligrams of sodium per serving (as listed on the food label).  Use salt-free seasonings or herbs instead of table salt or sea salt.  Check with your health care provider or pharmacist before using salt substitutes.  Eat lower-sodium products. These are often labeled as "low-sodium" or "no salt added."  Eat fresh foods. Avoid eating a lot of canned foods.  Eat more vegetables, fruits, and low-fat dairy products.  Choose whole grains. Look for the word "whole" as the first word in the ingredient list.  Choose fish and skinless chicken or turkey more often than red meat. Limit fish, poultry, and meat to 6 oz (170 g) each day.  Limit sweets, desserts, sugars, and sugary drinks.  Choose heart-healthy fats.  Eat more home-cooked food and less restaurant, buffet, and fast food.  Limit fried foods.  Do not fry foods. Cook foods using methods such as baking, boiling, grilling, and broiling instead.  When eating at a restaurant, ask that your food be prepared with less salt, or no salt if possible. What foods can I eat? Seek help from a dietitian for individual calorie needs. Grains  Whole grain or whole wheat bread. Brown rice. Whole grain or whole wheat pasta. Quinoa, bulgur, and whole grain cereals. Low-sodium cereals. Corn or whole wheat flour tortillas. Whole grain cornbread. Whole grain crackers. Low-sodium crackers. Vegetables  Fresh or frozen vegetables (raw, steamed, roasted, or  grilled). Low-sodium or reduced-sodium tomato and vegetable juices. Low-sodium or reduced-sodium tomato sauce and paste. Low-sodium or reduced-sodium canned vegetables. Fruits  All fresh, canned (in natural juice), or frozen fruits. Meat and Other Protein Products  Ground beef (85% or leaner), grass-fed beef, or beef trimmed of fat. Skinless chicken or turkey. Ground chicken or turkey. Pork trimmed of fat. All fish and seafood. Eggs. Dried beans, peas, or lentils. Unsalted nuts and seeds. Unsalted canned beans. Dairy  Low-fat dairy products, such as skim or 1% milk, 2% or reduced-fat cheeses, low-fat ricotta or cottage cheese, or plain low-fat yogurt. Low-sodium or reduced-sodium cheeses. Fats and Oils  Tub margarines without trans fats. Light or reduced-fat mayonnaise and salad dressings (reduced sodium). Avocado. Safflower, olive, or canola oils. Natural peanut or almond butter. Other  Unsalted popcorn and pretzels. The items listed above may not be a complete list of recommended foods or beverages. Contact your dietitian for more options.  What foods are not recommended? Grains  White bread. White pasta. White rice. Refined cornbread. Bagels and croissants. Crackers that contain trans fat. Vegetables  Creamed or fried vegetables. Vegetables in a cheese sauce. Regular canned vegetables. Regular canned tomato sauce and paste. Regular tomato and vegetable juices. Fruits  Canned fruit in light or heavy syrup. Fruit juice. Meat and Other Protein Products  Fatty cuts of meat. Ribs, chicken wings, bacon, sausage, bologna, salami, chitterlings, fatback, hot dogs, bratwurst, and packaged luncheon meats. Salted nuts and seeds. Canned beans with salt. Dairy  Whole or 2% milk, cream, half-and-half, and cream cheese. Whole-fat or sweetened yogurt. Full-fat cheeses   or blue cheese. Nondairy creamers and whipped toppings. Processed cheese, cheese spreads, or cheese curds. Condiments  Onion and garlic  salt, seasoned salt, table salt, and sea salt. Canned and packaged gravies. Worcestershire sauce. Tartar sauce. Barbecue sauce. Teriyaki sauce. Soy sauce, including reduced sodium. Steak sauce. Fish sauce. Oyster sauce. Cocktail sauce. Horseradish. Ketchup and mustard. Meat flavorings and tenderizers. Bouillon cubes. Hot sauce. Tabasco sauce. Marinades. Taco seasonings. Relishes. Fats and Oils  Butter, stick margarine, lard, shortening, ghee, and bacon fat. Coconut, palm kernel, or palm oils. Regular salad dressings. Other  Pickles and olives. Salted popcorn and pretzels. The items listed above may not be a complete list of foods and beverages to avoid. Contact your dietitian for more information.  Where can I find more information? National Heart, Lung, and Blood Institute: www.nhlbi.nih.gov/health/health-topics/topics/dash/ This information is not intended to replace advice given to you by your health care provider. Make sure you discuss any questions you have with your health care provider. Document Released: 07/10/2011 Document Revised: 12/27/2015 Document Reviewed: 05/25/2013 Elsevier Interactive Patient Education  2017 Elsevier Inc.  

## 2016-06-23 ENCOUNTER — Ambulatory Visit: Payer: Self-pay | Admitting: Physician Assistant

## 2016-07-01 ENCOUNTER — Ambulatory Visit (INDEPENDENT_AMBULATORY_CARE_PROVIDER_SITE_OTHER): Payer: BLUE CROSS/BLUE SHIELD | Admitting: Physician Assistant

## 2016-07-01 ENCOUNTER — Encounter: Payer: Self-pay | Admitting: Physician Assistant

## 2016-07-01 VITALS — BP 134/84 | HR 84 | Temp 97.7°F | Ht 68.0 in | Wt 227.0 lb

## 2016-07-01 DIAGNOSIS — J0100 Acute maxillary sinusitis, unspecified: Secondary | ICD-10-CM

## 2016-07-01 MED ORDER — AMOXICILLIN 500 MG PO CAPS: 1000.0000 mg | ORAL_CAPSULE | Freq: Two times a day (BID) | ORAL | 0 refills | Status: DC

## 2016-07-01 MED ORDER — METHYLPREDNISOLONE ACETATE 80 MG/ML IJ SUSP
80.0000 mg | Freq: Once | INTRAMUSCULAR | Status: AC
  Administered 2016-07-01: 80 mg via INTRAMUSCULAR

## 2016-07-01 NOTE — Patient Instructions (Signed)
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
## 2016-07-03 ENCOUNTER — Encounter: Payer: Self-pay | Admitting: Physician Assistant

## 2016-07-04 ENCOUNTER — Telehealth: Payer: Self-pay | Admitting: Physician Assistant

## 2016-07-04 MED ORDER — ALBUTEROL SULFATE HFA 108 (90 BASE) MCG/ACT IN AERS
2.0000 | INHALATION_SPRAY | Freq: Four times a day (QID) | RESPIRATORY_TRACT | 0 refills | Status: DC | PRN
Start: 2016-07-04 — End: 2016-10-03

## 2016-07-04 MED ORDER — AZITHROMYCIN 250 MG PO TABS: ORAL_TABLET | ORAL | 0 refills | Status: DC

## 2016-07-04 NOTE — Telephone Encounter (Signed)
med

## 2016-07-11 ENCOUNTER — Ambulatory Visit (INDEPENDENT_AMBULATORY_CARE_PROVIDER_SITE_OTHER): Payer: Worker's Compensation | Admitting: Orthopedic Surgery

## 2016-07-11 ENCOUNTER — Ambulatory Visit (INDEPENDENT_AMBULATORY_CARE_PROVIDER_SITE_OTHER): Payer: Self-pay

## 2016-07-11 DIAGNOSIS — M1710 Unilateral primary osteoarthritis, unspecified knee: Secondary | ICD-10-CM
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
## 2016-07-17 ENCOUNTER — Telehealth: Payer: Self-pay

## 2016-07-17 NOTE — Telephone Encounter (Signed)
Check to see if patient has tried viagra, this is what the insurance prefers.

## 2016-07-17 NOTE — Telephone Encounter (Signed)
Pt says he will try the Viagra

## 2016-07-18 MED ORDER — SILDENAFIL CITRATE 100 MG PO TABS: 50.0000 mg | ORAL_TABLET | Freq: Every day | ORAL | 11 refills | Status: DC | PRN

## 2016-07-18 NOTE — Telephone Encounter (Signed)
Prescription sent to pharmacy.

## 2016-07-18 NOTE — Addendum Note (Signed)
Addended by: Remus LofflerJONES, Neeley Sedivy S on: 07/18/2016 09:46 AM   Modules accepted: Orders

## 2016-07-18 NOTE — Telephone Encounter (Signed)
Patient aware, script is ready. 

## 2016-07-18 NOTE — Telephone Encounter (Signed)
Sent!

## 2016-07-23 ENCOUNTER — Other Ambulatory Visit: Payer: Self-pay

## 2016-07-23 DIAGNOSIS — N5201 Erectile dysfunction due to arterial insufficiency: Secondary | ICD-10-CM

## 2016-07-23 MED ORDER — TADALAFIL 20 MG PO TABS: 10.0000 mg | ORAL_TABLET | ORAL | 11 refills | Status: DC | PRN

## 2016-07-31 ENCOUNTER — Other Ambulatory Visit (INDEPENDENT_AMBULATORY_CARE_PROVIDER_SITE_OTHER): Payer: Self-pay

## 2016-07-31 MED ORDER — NAPROXEN 500 MG PO TABS: 500.0000 mg | ORAL_TABLET | Freq: Two times a day (BID) | ORAL | 0 refills | Status: DC

## 2016-07-31 NOTE — Progress Notes (Unsigned)
naproxen

## 2016-08-20 ENCOUNTER — Ambulatory Visit (INDEPENDENT_AMBULATORY_CARE_PROVIDER_SITE_OTHER): Payer: Self-pay | Admitting: Orthopedic Surgery

## 2016-08-27 ENCOUNTER — Encounter (INDEPENDENT_AMBULATORY_CARE_PROVIDER_SITE_OTHER): Payer: Self-pay | Admitting: Orthopedic Surgery

## 2016-08-27 ENCOUNTER — Ambulatory Visit (INDEPENDENT_AMBULATORY_CARE_PROVIDER_SITE_OTHER): Payer: Self-pay | Admitting: Orthopedic Surgery

## 2016-08-27 DIAGNOSIS — S83242D Other tear of medial meniscus, current injury, left knee, subsequent encounter: Secondary | ICD-10-CM
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
## 2016-08-29 ENCOUNTER — Other Ambulatory Visit: Payer: Self-pay | Admitting: Physician Assistant

## 2016-10-03 ENCOUNTER — Encounter: Payer: Self-pay | Admitting: Pediatrics

## 2016-10-03 ENCOUNTER — Ambulatory Visit (INDEPENDENT_AMBULATORY_CARE_PROVIDER_SITE_OTHER): Payer: BLUE CROSS/BLUE SHIELD | Admitting: Pediatrics

## 2016-10-03 VITALS — BP 134/87 | HR 64 | Temp 97.4°F | Ht 68.0 in | Wt 225.4 lb

## 2016-10-03 DIAGNOSIS — M6281 Muscle weakness (generalized): Secondary | ICD-10-CM

## 2016-10-03 DIAGNOSIS — M7989 Other specified soft tissue disorders: Secondary | ICD-10-CM

## 2016-10-03 DIAGNOSIS — R252 Cramp and spasm: Secondary | ICD-10-CM

## 2016-10-03 DIAGNOSIS — I10 Essential (primary) hypertension: Secondary | ICD-10-CM | POA: Diagnosis not present

## 2016-10-03 MED ORDER — VALSARTAN 160 MG PO TABS: 160.0000 mg | ORAL_TABLET | Freq: Two times a day (BID) | ORAL | 0 refills | Status: DC

## 2016-10-03 NOTE — Progress Notes (Signed)
  Subjective:   Patient ID: [name], male    DOB: 1961/02/01, 56 y.o.   MRN: [id number] CC: Leg cramps  HPI: [name] is a 56 y.o. male presenting for Leg cramps Has cramps in R foot, calf when driving Also in L calf Ongoing past two weeks Says he is very active  Feels achy in the morning when he wakes up Takes shower then can start moving better  Started on HCTZ 3 months ago Stopped taking it one week ago Leg cramps the same Has had leg swelling for several months, noticing it at the end of the day  HTN: Bps at home have been 130s/80s Stopped hctz a week ago Has been on amlodipine for several months  Weakness in shoulders and legs at times, has to pull himself up more form the floor over the past few months  MGM with SLE  Relevant past medical, surgical, family and social history reviewed. Allergies and medications reviewed and updated. History  Smoking Status  . Never Smoker  Smokeless Tobacco  . Current User  . Types: Chew    Comment: last chew the p.m. of 04/20/16   ROS: Per HPI   Objective:    BP 134/87   Pulse 64   Temp 97.4 F (36.3 C) (Oral)   Ht '5\' 8"'$  (1.727 m)   Wt 225 lb 6.4 oz (102.2 kg)   BMI 34.27 kg/m   Wt Readings from Last 3 Encounters:  10/03/16 225 lb 6.4 oz (102.2 kg)  07/01/16 227 lb (103 kg)  06/17/16 227 lb 9.6 oz (103.2 kg)    Gen: NAD, alert, cooperative with exam, NCAT EYES: EOMI, no conjunctival injection, or no icterus ENT: OP without erythema LYMPH: no cervical LAD CV: NRRR, normal S1/S2, no murmur, distal pulses 2+ b/l Resp: CTABL, no wheezes, normal WOB Abd: +BS, soft, NTND. no guarding or organomegaly Ext: trace pre-tibial pitting edema, warm Neuro: Alert and oriented, strength equal b/l UE and LE, coordination grossly normal MSK: normal muscle bulk  Assessment & Plan:  [name] was seen today for leg cramps.  Diagnoses and all orders for this visit:  Muscle weakness Not feeling as strong  standing up -     BMP8+EGFR -     Magnesium -     CK  Leg cramp Drink plenty of fluids -     BMP8+EGFR -     Magnesium -     CK  Essential hypertension Has been off of HCTZ for a week Bothered by LE swelling Stop amlodipine to see if swelling improved Increase valsartan to BID Check BPs at home  Leg swelling Compression hose Stopping amlodipine as above  Other orders -     valsartan (DIOVAN) 160 MG tablet; Take 1 tablet (160 mg total) by mouth 2 (two) times daily.   Follow up plan: 3 mo Assunta Found, MD [place]

## 2016-10-03 NOTE — Patient Instructions (Signed)
Stop amlodipine, HCTZ Take valsartan 160mg  twice a day Labs today

## 2016-10-04 LAB — MAGNESIUM: Magnesium: 2.1 mg/dL (ref 1.6–2.3)

## 2016-10-04 LAB — BMP8+EGFR
BUN/Creatinine Ratio: 12 (ref 9–20)
BUN: 10 mg/dL (ref 6–24)
CALCIUM: 9.2 mg/dL (ref 8.7–10.2)
CO2: 23 mmol/L (ref 18–29)
Chloride: 103 mmol/L (ref 96–106)
Creatinine, Ser: 0.84 mg/dL (ref 0.76–1.27)
GFR, EST AFRICAN AMERICAN: 116 mL/min/{1.73_m2} (ref >59)
GFR, EST NON AFRICAN AMERICAN: 100 mL/min/{1.73_m2} (ref >59)
Glucose: 98 mg/dL (ref 65–99)
POTASSIUM: 4.4 mmol/L (ref 3.5–5.2)
SODIUM: 142 mmol/L (ref 134–144)

## 2016-10-04 LAB — CK: Total CK: 182 U/L (ref 24–204)

## 2016-10-20 ENCOUNTER — Other Ambulatory Visit: Payer: Self-pay | Admitting: Family Medicine

## 2016-10-20 ENCOUNTER — Ambulatory Visit (INDEPENDENT_AMBULATORY_CARE_PROVIDER_SITE_OTHER): Payer: BLUE CROSS/BLUE SHIELD | Admitting: Family Medicine

## 2016-10-20 ENCOUNTER — Encounter: Payer: Self-pay | Admitting: Family Medicine

## 2016-10-20 VITALS — BP 144/94 | HR 88 | Temp 101.2°F | Ht 68.0 in | Wt 224.0 lb

## 2016-10-20 DIAGNOSIS — J111 Influenza due to unidentified influenza virus with other respiratory manifestations: Secondary | ICD-10-CM

## 2016-10-20 MED ORDER — BENZONATATE 200 MG PO CAPS: 200.0000 mg | ORAL_CAPSULE | Freq: Three times a day (TID) | ORAL | 0 refills | Status: DC | PRN

## 2016-10-20 MED ORDER — OSELTAMIVIR PHOSPHATE 75 MG PO CAPS: 75.0000 mg | ORAL_CAPSULE | Freq: Two times a day (BID) | ORAL | 0 refills | Status: DC
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
## 2016-10-22 ENCOUNTER — Telehealth: Payer: Self-pay | Admitting: Family Medicine

## 2016-10-22 NOTE — Telephone Encounter (Signed)
Aware. 

## 2016-10-22 NOTE — Telephone Encounter (Signed)
Please contact the patient Ibuprofen should help both fever and sore throat. They are very common with the flu.Continue the tamiflu for now.

## 2016-10-23 ENCOUNTER — Other Ambulatory Visit: Payer: Self-pay | Admitting: Family Medicine

## 2016-10-23 MED ORDER — AMOXICILLIN-POT CLAVULANATE 875-125 MG PO TABS: 1.0000 | ORAL_TABLET | Freq: Two times a day (BID) | ORAL | 0 refills | Status: DC

## 2016-10-23 NOTE — Telephone Encounter (Signed)
Please contact the patient the think it's time to go ahead and add an antibiotic. I am a bit concerned about his use of Hycodan since he has an allergy to oxycodone and morphine products.

## 2016-10-23 NOTE — Telephone Encounter (Signed)
Pt aware abx sent to pharmacy 

## 2016-10-23 NOTE — Telephone Encounter (Signed)
Seen Monday still running a low grade fever even while alternating tylenol & ibuprofen q 3 hours. Hoarse, cough non productive, tessalon not helping much hycodan he had at home helps a little better. Some runny nose, no sinus pressure. Chest congestion and ear fullness. Can something else be sent in or NTBS again.

## 2016-10-28 ENCOUNTER — Telehealth: Payer: Self-pay | Admitting: Family Medicine

## 2016-10-28 NOTE — Telephone Encounter (Signed)
Patient called stating that Augmentin is causing Diarrhea.  Patient states that he has lost about 10 pounds in 1 week.  Patient will stop Augmentin.

## 2016-11-18 ENCOUNTER — Telehealth: Payer: Self-pay

## 2016-11-18 MED ORDER — LORATADINE 10 MG PO TABS: 10.0000 mg | ORAL_TABLET | Freq: Every day | ORAL | 3 refills | Status: DC

## 2016-11-18 NOTE — Telephone Encounter (Signed)
Patient aware of medication change due to insurance coverage 

## 2016-12-26 ENCOUNTER — Ambulatory Visit (INDEPENDENT_AMBULATORY_CARE_PROVIDER_SITE_OTHER): Payer: Self-pay | Admitting: Orthopedic Surgery

## 2017-01-01 ENCOUNTER — Telehealth: Payer: Self-pay

## 2017-01-01 MED ORDER — FEXOFENADINE HCL 180 MG PO TABS: 180.0000 mg | ORAL_TABLET | Freq: Every day | ORAL | 3 refills | Status: DC

## 2017-01-02 ENCOUNTER — Ambulatory Visit (INDEPENDENT_AMBULATORY_CARE_PROVIDER_SITE_OTHER): Payer: Self-pay | Admitting: Orthopedic Surgery

## 2017-01-02 ENCOUNTER — Encounter (INDEPENDENT_AMBULATORY_CARE_PROVIDER_SITE_OTHER): Payer: Self-pay | Admitting: Orthopedic Surgery

## 2017-01-02 ENCOUNTER — Ambulatory Visit (INDEPENDENT_AMBULATORY_CARE_PROVIDER_SITE_OTHER): Payer: BLUE CROSS/BLUE SHIELD | Admitting: Orthopedic Surgery

## 2017-01-02 VITALS — Ht 69.0 in | Wt 212.0 lb

## 2017-01-02 DIAGNOSIS — S83242D Other tear of medial meniscus, current injury, left knee, subsequent encounter: Secondary | ICD-10-CM | POA: Diagnosis not present
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
## 2017-01-02 NOTE — Telephone Encounter (Signed)
Pt aware Rx sent to pharmacy Will let us know how this helps

## 2017-02-13 ENCOUNTER — Ambulatory Visit (INDEPENDENT_AMBULATORY_CARE_PROVIDER_SITE_OTHER): Payer: BLUE CROSS/BLUE SHIELD | Admitting: Physician Assistant

## 2017-02-13 ENCOUNTER — Encounter: Payer: Self-pay | Admitting: Physician Assistant

## 2017-02-13 ENCOUNTER — Ambulatory Visit (INDEPENDENT_AMBULATORY_CARE_PROVIDER_SITE_OTHER): Payer: Self-pay | Admitting: Orthopedic Surgery

## 2017-02-13 VITALS — BP 143/81 | HR 61 | Temp 97.9°F | Ht 69.0 in | Wt 221.6 lb

## 2017-02-13 DIAGNOSIS — M778 Other enthesopathies, not elsewhere classified: Secondary | ICD-10-CM

## 2017-02-13 DIAGNOSIS — M7581 Other shoulder lesions, right shoulder: Secondary | ICD-10-CM | POA: Diagnosis not present

## 2017-02-13 MED ORDER — METHYLPREDNISOLONE ACETATE 80 MG/ML IJ SUSP
80.0000 mg | Freq: Once | INTRAMUSCULAR | Status: AC
  Administered 2017-02-13: 80 mg via INTRAMUSCULAR

## 2017-02-13 MED ORDER — METAXALONE 800 MG PO TABS: 800.0000 mg | ORAL_TABLET | Freq: Three times a day (TID) | ORAL | 1 refills | Status: DC

## 2017-02-16 ENCOUNTER — Other Ambulatory Visit: Payer: Self-pay | Admitting: Physician Assistant
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
## 2017-02-16 NOTE — Patient Instructions (Signed)
Survey:

## 2017-02-17 ENCOUNTER — Telehealth: Payer: Self-pay | Admitting: Physician Assistant

## 2017-02-17 MED ORDER — OLMESARTAN MEDOXOMIL 40 MG PO TABS: 40.0000 mg | ORAL_TABLET | Freq: Every day | ORAL | 3 refills | Status: DC

## 2017-02-17 NOTE — Telephone Encounter (Signed)
Patient aware of recommendation.  

## 2017-02-17 NOTE — Telephone Encounter (Signed)
Patient states that valsartan has been recalled and he is unable to get from pharmacy. [facility] advised that he should stop taking what he has at home. Please advise

## 2017-02-19 ENCOUNTER — Telehealth: Payer: Self-pay | Admitting: Physician Assistant

## 2017-02-19 NOTE — Telephone Encounter (Signed)
Spoke to pt and he is concerned about the Benicar that you put him on instead of the Valsartan. He started taking it 2 days ago and had an incidence last night of feeling dizzy and lightheaded so he checked his BP and it was 110/65. This morning while sitting on the toilet having a BM he felt like he was going to pass out and he tried to check his own BP manually Systolic was 130 but he couldn't hear the Diastolic number and he says when he stands up really fast he gets dizzy feeling. Pt works outside all day and tries to stay hydrated but isn't sure if it is dehydration or the Benicar. Pt is asymptomatic now so I advised him to push fluids and monitor his BP and I would let you know what was going on. Please advise.

## 2017-02-19 NOTE — Telephone Encounter (Signed)
Patient aware of recommendation.  

## 2017-02-19 NOTE — Telephone Encounter (Signed)
Cut it in half, it is working too well.

## 2017-02-27 ENCOUNTER — Ambulatory Visit (INDEPENDENT_AMBULATORY_CARE_PROVIDER_SITE_OTHER): Payer: BLUE CROSS/BLUE SHIELD | Admitting: Orthopedic Surgery

## 2017-02-27 ENCOUNTER — Encounter (INDEPENDENT_AMBULATORY_CARE_PROVIDER_SITE_OTHER): Payer: Self-pay | Admitting: Orthopedic Surgery

## 2017-02-27 ENCOUNTER — Ambulatory Visit (INDEPENDENT_AMBULATORY_CARE_PROVIDER_SITE_OTHER): Payer: Self-pay

## 2017-02-27 DIAGNOSIS — M25511 Pain in right shoulder: Secondary | ICD-10-CM | POA: Diagnosis not present

## 2017-02-27 MED ORDER — BUPIVACAINE HCL 0.5 % IJ SOLN
9.0000 mL | INTRAMUSCULAR | Status: AC | PRN
  Administered 2017-02-27: 9 mL via INTRA_ARTICULAR

## 2017-02-27 MED ORDER — METHYLPREDNISOLONE ACETATE 40 MG/ML IJ SUSP
40.0000 mg | INTRAMUSCULAR | Status: AC | PRN
  Administered 2017-02-27: 40 mg via INTRA_ARTICULAR

## 2017-02-27 MED ORDER — LIDOCAINE HCL 1 % IJ SOLN
5.0000 mL | INTRAMUSCULAR | Status: AC | PRN
  Administered 2017-02-27: 5 mL
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
## 2017-04-10 ENCOUNTER — Telehealth: Payer: Self-pay | Admitting: Physician Assistant

## 2017-04-10 NOTE — Telephone Encounter (Signed)
BP is doing okay been running 130/80. Heart rate has been running low at one point it was at 39 on 09/07. This morning it was around 64. Please advise

## 2017-04-10 NOTE — Telephone Encounter (Signed)
Monitor over the next few days. Make sure he is hydrated, anything 60 and up is normal.  Either he can call his cardiologist, or we can, to evaluate for bradycardia if it continues.

## 2017-04-10 NOTE — Telephone Encounter (Signed)
Aware of provider advice. 

## 2017-07-14 ENCOUNTER — Other Ambulatory Visit: Payer: Self-pay | Admitting: Physician Assistant

## 2017-07-15 MED ORDER — PANTOPRAZOLE SODIUM 40 MG PO TBEC: 40.0000 mg | DELAYED_RELEASE_TABLET | Freq: Every day | ORAL | 1 refills | Status: DC

## 2017-07-15 MED ORDER — ASPIRIN 81 MG PO TBEC: 81.0000 mg | DELAYED_RELEASE_TABLET | Freq: Every day | ORAL | 0 refills | Status: DC

## 2017-07-20 ENCOUNTER — Telehealth: Payer: Self-pay | Admitting: Physician Assistant

## 2017-07-22 NOTE — Telephone Encounter (Signed)
So what am I to do with this? Should it go to [facility]?

## 2017-08-17 ENCOUNTER — Ambulatory Visit: Payer: BLUE CROSS/BLUE SHIELD | Admitting: Family Medicine

## 2017-08-17 ENCOUNTER — Encounter: Payer: Self-pay | Admitting: Family Medicine

## 2017-08-17 VITALS — BP 138/80 | HR 71 | Temp 98.4°F | Ht 69.0 in | Wt 220.0 lb

## 2017-08-17 DIAGNOSIS — J020 Streptococcal pharyngitis: Secondary | ICD-10-CM | POA: Diagnosis not present

## 2017-08-17 LAB — VERITOR FLU A/B WAIVED
INFLUENZA B: NEGATIVE
Influenza A: NEGATIVE

## 2017-08-17 MED ORDER — METOPROLOL TARTRATE 25 MG PO TABS: 12.5000 mg | ORAL_TABLET | Freq: Two times a day (BID) | ORAL | 2 refills | Status: DC

## 2017-08-17 MED ORDER — AMOXICILLIN 500 MG PO CAPS: 500.0000 mg | ORAL_CAPSULE | Freq: Three times a day (TID) | ORAL | 0 refills | Status: DC

## 2017-08-17 NOTE — Progress Notes (Signed)
BP 138/80   Pulse 71   Temp 98.4 F (36.9 C) (Oral)   Ht 5\' 9"  (1.753 m)   Wt 220 lb (99.8 kg)   BMI 32.49 kg/m    Subjective:    Patient ID: [name], male    DOB: [birth year], 57 y.o.   MRN: [id number]  HPI: [name] is a 57 y.o. male presenting on 08/17/2017 for Fever, chills, body aches, cough, sinus congestion (began yesterday) and Torticollis (x 3 days, has tried muscle relaxers, icy hot, and heat)   HPI Sore throat and congestion and fever and neck tightness Cough and congestion and fever that is been going on for the past couple days and neck tightness is been going on for 3 days.  He denies any abnormal rash.  His fever was low-grade at 100.  He denies any sick contacts that he knows of.  He has had a little bit of cough but not significant amount.  He has been using IcyHot muscle relaxers that have helped some but not significantly.  He then has developed his generalized body aching that started yesterday and having more sinus congestion and drainage and a sore throat.  Relevant past medical, surgical, family and social history reviewed and updated as indicated. Interim medical history since our last visit reviewed. Allergies and medications reviewed and updated.  Review of Systems  Constitutional: Positive for fever. Negative for chills.  HENT: Positive for congestion, postnasal drip, rhinorrhea, sinus pressure, sneezing and sore throat. Negative for ear discharge, ear pain and voice change.   Eyes: Negative for pain, discharge, redness and visual disturbance.  Respiratory: Positive for cough. Negative for shortness of breath and wheezing.   Cardiovascular: Negative for chest pain and leg swelling.  Musculoskeletal: Positive for neck pain. Negative for gait problem.  Skin: Negative for rash.  All other systems reviewed and are negative.   Per HPI unless specifically indicated above        Objective:    BP 138/80   Pulse 71   Temp 98.4 F  (36.9 C) (Oral)   Ht 5\' 9"  (1.753 m)   Wt 220 lb (99.8 kg)   BMI 32.49 kg/m   Wt Readings from Last 3 Encounters:  08/17/17 220 lb (99.8 kg)  02/13/17 221 lb 9.6 oz (100.5 kg)  01/02/17 212 lb (96.2 kg)    Physical Exam  Constitutional: He is oriented to person, place, and time. He appears well-developed and well-nourished. No distress.  HENT:  Right Ear: Tympanic membrane, external ear and ear canal normal.  Left Ear: Tympanic membrane, external ear and ear canal normal.  Nose: Mucosal edema and rhinorrhea present. No sinus tenderness. No epistaxis. Right sinus exhibits maxillary sinus tenderness. Right sinus exhibits no frontal sinus tenderness. Left sinus exhibits maxillary sinus tenderness. Left sinus exhibits no frontal sinus tenderness.  Mouth/Throat: Uvula is midline and mucous membranes are normal. Posterior oropharyngeal edema and posterior oropharyngeal erythema (Petechiae and posterior oropharynx) present. No oropharyngeal exudate or tonsillar abscesses.  Eyes: Conjunctivae and EOM are normal. Pupils are equal, round, and reactive to light. Right eye exhibits no discharge. No scleral icterus.  Neck: Normal range of motion. Neck supple. Muscular tenderness present. No neck rigidity. No thyromegaly present.  Cardiovascular: Normal rate, regular rhythm, normal heart sounds and intact distal pulses.  No murmur heard. Pulmonary/Chest: Effort normal and breath sounds normal. No respiratory distress. He has no wheezes. He has no rales.  Musculoskeletal: Normal range of motion. He exhibits  no edema.  Lymphadenopathy:    He has no cervical adenopathy.  Neurological: He is alert and oriented to person, place, and time. Coordination normal.  Skin: Skin is warm and dry. No rash noted. He is not diaphoretic.  Psychiatric: He has a normal mood and affect. His behavior is normal.  Nursing note and vitals reviewed.   Influenza negative    Assessment & Plan:   Problem List Items  Addressed This Visit    None    Visit Diagnoses    Pharyngitis due to Streptococcus species    -  Primary   Relevant Medications   amoxicillin (AMOXIL) 500 MG capsule   Other Relevant Orders   Veritor Flu A/B Waived (Completed)       Follow up plan: Return if symptoms worsen or fail to improve.  Counseling provided for all of the vaccine components Orders Placed This Encounter  Procedures  . Veritor Flu A/B Reynolds BowlWaived    Joshua Dettinger, MD [facility] 08/17/2017, 11:38 AM

## 2017-08-25 ENCOUNTER — Other Ambulatory Visit: Payer: Self-pay | Admitting: *Deleted

## 2017-08-25 MED ORDER — MONTELUKAST SODIUM 10 MG PO TABS: 10.0000 mg | ORAL_TABLET | Freq: Every day | ORAL | 3 refills | Status: DC

## 2017-09-05 ENCOUNTER — Ambulatory Visit: Payer: BLUE CROSS/BLUE SHIELD | Admitting: Family Medicine

## 2017-09-05 VITALS — BP 123/76 | HR 58 | Temp 96.8°F | Ht 69.0 in | Wt 219.0 lb

## 2017-09-05 DIAGNOSIS — H10021 Other mucopurulent conjunctivitis, right eye: Secondary | ICD-10-CM | POA: Diagnosis not present

## 2017-09-05 MED ORDER — OFLOXACIN 0.3 % OP SOLN: 1.0000 [drp] | Freq: Four times a day (QID) | OPHTHALMIC | 0 refills | Status: DC

## 2017-09-05 NOTE — Patient Instructions (Signed)
Great to see you!   Bacterial Conjunctivitis Bacterial conjunctivitis is an infection of your conjunctiva. This is the clear membrane that covers the white part of your eye and the inner surface of your eyelid. This condition can make your eye:  Red or pink.  Itchy.  This condition is caused by bacteria. This condition spreads very easily from person to person (is contagious) and from one eye to the other eye. Follow these instructions at home: Medicines  Take or apply your antibiotic medicine as told by your doctor. Do not stop taking or applying the antibiotic even if you start to feel better.  Take or apply over-the-counter and prescription medicines only as told by your doctor.  Do not touch your eyelid with the eye drop bottle or the ointment tube. Managing discomfort  Wipe any fluid from your eye with a warm, wet washcloth or a cotton ball.  Place a cool, clean washcloth on your eye. Do this for 10-20 minutes, 3-4 times per day. General instructions  Do not wear contact lenses until the irritation is gone. Wear glasses until your doctor says it is okay to wear contacts.  Do not wear eye makeup until your symptoms are gone. Throw away any old makeup.  Change or wash your pillowcase every day.  Do not share towels or washcloths with anyone.  Wash your hands often with soap and water. Use paper towels to dry your hands.  Do not touch or rub your eyes.  Do not drive or use heavy machinery if your vision is blurry. Contact a doctor if:  You have a fever.  Your symptoms do not get better after 10 days. Get help right away if:  You have a fever and your symptoms suddenly get worse.  You have very bad pain when you move your eye.  Your face: ? Hurts. ? Is red. ? Is swollen.  You have sudden loss of vision. This information is not intended to replace advice given to you by your health care provider. Make sure you discuss any questions you have with your health  care provider. Document Released: 04/29/2008 Document Revised: 12/27/2015 Document Reviewed: 05/03/2015 Elsevier Interactive Patient Education  [employer]2018 Elsevier Inc.

## 2017-09-05 NOTE — Progress Notes (Signed)
   HPI  Patient presents today here for right eye.  Patient explains that foreign body sensation started on Thursday, he works as a Corporate investment bankerconstruction worker and thought maybe he got some sawdust in the eye.  He rinsed it out without effect. He began having red eye and mattering of the right eye starting the following day.  He was around a bunch of school children on Wednesday night at church.  He also has runny nose, mild cough.  He is recently had strep pharyngitis treated with amoxicillin with good improvement from that.  PMH: Smoking status noted ROS: Per HPI  Objective: BP 123/76   Pulse (!) 58   Temp (!) 96.8 F (36 C) (Oral)   Ht 5\' 9"  (1.753 m)   Wt 219 lb (99.3 kg)   BMI 32.34 kg/m  Gen: NAD, alert, cooperative with exam HEENT: NCAT, EOMI, conjunctival injection on the right, normal texture and nontender globe CV: RRR, good S1/S2, no murmur Resp: CTABL, no wheezes, non-labored Abd: SNTND, BS present, no guarding or organomegaly Ext: No edema, warm Neuro: Alert and oriented, No gross deficits  Assessment and plan:  #Pinkeye Patient with signs and symptoms of pinkeye, likely adenovirus with runny nose and cough. Ocuflox Discussed possible foreign body, however not seen on exam. Patient states that he has longtime legal blindness of the right eye and states that he has had no change in vision.     Meds ordered this encounter  Medications  . ofloxacin (OCUFLOX) 0.3 % ophthalmic solution    Sig: Place 1 drop into the right eye 4 (four) times daily.    Dispense:  5 mL    Refill:  0    Murtis SinkSam Naiomi Musto, MD [facility] 09/05/2017, 10:57 AM

## 2017-09-16 ENCOUNTER — Ambulatory Visit: Payer: BLUE CROSS/BLUE SHIELD | Admitting: Family Medicine

## 2017-09-16 ENCOUNTER — Encounter: Payer: Self-pay | Admitting: Family Medicine

## 2017-09-16 VITALS — BP 133/79 | HR 92 | Temp 98.2°F | Ht 69.0 in | Wt 220.0 lb

## 2017-09-16 DIAGNOSIS — R1031 Right lower quadrant pain: Secondary | ICD-10-CM | POA: Diagnosis not present

## 2017-09-16 LAB — MICROSCOPIC EXAMINATION
Bacteria, UA: NONE SEEN
Epithelial Cells (non renal): NONE SEEN /hpf (ref 0–10)
RBC, UA: NONE SEEN /hpf (ref 0–?)
Renal Epithel, UA: NONE SEEN /hpf
WBC, UA: NONE SEEN /hpf (ref 0–?)

## 2017-09-16 LAB — URINALYSIS, COMPLETE
Bilirubin, UA: NEGATIVE
GLUCOSE, UA: NEGATIVE
KETONES UA: NEGATIVE
LEUKOCYTES UA: NEGATIVE
Nitrite, UA: NEGATIVE
Protein, UA: NEGATIVE
RBC, UA: NEGATIVE
SPEC GRAV UA: 1.02 (ref 1.005–1.030)
Urobilinogen, Ur: 0.2 mg/dL (ref 0.2–1.0)
pH, UA: 6 (ref 5.0–7.5)

## 2017-09-16 NOTE — Progress Notes (Signed)
BP 133/79   Pulse 92   Temp 98.2 F (36.8 C) (Oral)   Ht 5\' 9"  (1.753 m)   Wt 220 lb (99.8 kg)   BMI 32.49 kg/m    Subjective:    Patient ID: [name], male    DOB: [birth year], 57 y.o.   MRN: [id number]  HPI: [name] is a 57 y.o. male presenting on 09/16/2017 for Abdominal Pain, back pain, nausea   HPI Abdominal pain Patient is coming in with abdominal pain that comes around from his right flank down into his right lower abdomen that is been going on since 11 AM this but then felt even more nauseous today when the pain started.  He says the pain is worse than anything he has had before with his previous kidney stones.  He denies any fevers or chills.  He is not been feeling like eating much today but has eaten a little bit and kept it down.  He denies any blood in his stool.    Relevant past medical, surgical, family and social history reviewed and updated as indicated. Interim medical history since our last visit reviewed. Allergies and medications reviewed and updated.  Review of Systems  Constitutional: Negative for chills and fever.  Respiratory: Negative for shortness of breath and wheezing.   Cardiovascular: Negative for chest pain and leg swelling.  Gastrointestinal: Positive for abdominal distention, abdominal pain and nausea. Negative for anal bleeding, blood in stool, constipation, diarrhea and vomiting.  Genitourinary: Positive for flank pain. Negative for decreased urine volume, dysuria, frequency, hematuria and urgency.  Musculoskeletal: Negative for back pain and gait problem.  Skin: Negative for rash.  All other systems reviewed and are negative.   Per HPI unless specifically indicated above   Allergies as of 09/16/2017      Reactions   Hydrocodone Itching   Morphine And Related    "burning"   Oxycodone Itching      Medication List        Accurate as of 09/16/17  4:18 PM. Always use your most recent med list.          aspirin  81 MG EC tablet Commonly known as:  ASPIRIN LOW DOSE Take 1 tablet (81 mg total) by mouth daily. Swallow whole.   fexofenadine 180 MG tablet Commonly known as:  ALLEGRA Take 1 tablet (180 mg total) by mouth daily.   indomethacin 50 MG capsule Commonly known as:  INDOCIN TAKE ONE CAPSULE BY MOUTH 3 TIMES A DAY WITH FOOD OR MILK FOR 10 DAYS   metaxalone 800 MG tablet Commonly known as:  SKELAXIN Take 1 tablet (800 mg total) by mouth 3 (three) times daily.   metoprolol tartrate 25 MG tablet Commonly known as:  LOPRESSOR Take 0.5 tablets (12.5 mg total) by mouth 2 (two) times daily.   montelukast 10 MG tablet Commonly known as:  SINGULAIR Take 1 tablet (10 mg total) by mouth at bedtime.   ofloxacin 0.3 % ophthalmic solution Commonly known as:  OCUFLOX Place 1 drop into the right eye 4 (four) times daily.   olmesartan 40 MG tablet Commonly known as:  BENICAR Take 1 tablet (40 mg total) by mouth daily.   pantoprazole 40 MG tablet Commonly known as:  PROTONIX Take 1 tablet (40 mg total) by mouth daily.          Objective:    BP 133/79   Pulse 92   Temp 98.2 F (36.8 C) (Oral)   Ht  5\' 9"  (1.753 m)   Wt 220 lb (99.8 kg)   BMI 32.49 kg/m   Wt Readings from Last 3 Encounters:  09/16/17 220 lb (99.8 kg)  09/05/17 219 lb (99.3 kg)  08/17/17 220 lb (99.8 kg)    Physical Exam  Constitutional: He is oriented to person, place, and time. He appears well-developed and well-nourished. No distress.  Eyes: Conjunctivae are normal. No scleral icterus.  Cardiovascular: Normal rate, regular rhythm, normal heart sounds and intact distal pulses.  No murmur heard. Pulmonary/Chest: Effort normal and breath sounds normal. No respiratory distress. He has no wheezes. He has no rales.  Abdominal: Soft. Bowel sounds are normal. He exhibits no distension. There is no hepatosplenomegaly. There is tenderness in the right lower quadrant. There is CVA tenderness. There is no rigidity, no  rebound and no guarding.  Musculoskeletal: Normal range of motion. He exhibits no edema.  Neurological: He is alert and oriented to person, place, and time. Coordination normal.  Skin: Skin is warm and dry. No rash noted. He is not diaphoretic.  Psychiatric: He has a normal mood and affect. His behavior is normal.  Nursing note and vitals reviewed.   Urinalysis: Completely normal    Assessment & Plan:   Problem List Items Addressed This Visit    None    Visit Diagnoses    Right lower quadrant abdominal pain    -  Primary   Relevant Orders   Urinalysis, Complete   CT Abdomen Pelvis W Contrast    Insurance denied CT abdomen pelvis and patient is going to the emergency department  Follow up plan: Return if symptoms worsen or fail to improve.  Counseling provided for all of the vaccine components Orders Placed This Encounter  Procedures  . CT Abdomen Pelvis W Contrast  . Urinalysis, Complete    Arville CareJoshua Catelynn Sparger, MD [facility] 09/16/2017, 4:18 PM

## 2017-10-22 ENCOUNTER — Other Ambulatory Visit: Payer: Self-pay | Admitting: Physician Assistant

## 2017-11-30 ENCOUNTER — Telehealth: Payer: Self-pay | Admitting: *Deleted

## 2017-11-30 ENCOUNTER — Other Ambulatory Visit: Payer: Self-pay | Admitting: Physician Assistant

## 2017-11-30 MED ORDER — METOPROLOL TARTRATE 25 MG PO TABS: 12.5000 mg | ORAL_TABLET | Freq: Two times a day (BID) | ORAL | 5 refills | Status: DC

## 2017-11-30 NOTE — Telephone Encounter (Signed)
Fax received [facility] Metoprolol 25 mg written 1/2 tab BID Pt states he is to take 0.5 - 1 po BID Please advise

## 2017-11-30 NOTE — Telephone Encounter (Signed)
sent 

## 2017-11-30 NOTE — Telephone Encounter (Signed)
Patient aware.

## 2018-01-25 ENCOUNTER — Encounter: Payer: Self-pay | Admitting: Physician Assistant

## 2018-01-25 ENCOUNTER — Ambulatory Visit (INDEPENDENT_AMBULATORY_CARE_PROVIDER_SITE_OTHER): Payer: PRIVATE HEALTH INSURANCE | Admitting: Physician Assistant

## 2018-01-25 VITALS — BP 129/89 | HR 70 | Temp 97.0°F | Ht 69.0 in | Wt 229.8 lb

## 2018-01-25 DIAGNOSIS — J301 Allergic rhinitis due to pollen: Secondary | ICD-10-CM

## 2018-01-25 DIAGNOSIS — J0100 Acute maxillary sinusitis, unspecified: Secondary | ICD-10-CM

## 2018-01-25 MED ORDER — CEFDINIR 300 MG PO CAPS: 300.0000 mg | ORAL_CAPSULE | Freq: Two times a day (BID) | ORAL | 0 refills | Status: DC

## 2018-01-25 MED ORDER — BENZONATATE 200 MG PO CAPS: 200.0000 mg | ORAL_CAPSULE | Freq: Two times a day (BID) | ORAL | 0 refills | Status: DC | PRN

## 2018-01-25 MED ORDER — DESLORATADINE 5 MG PO TABS: 5.0000 mg | ORAL_TABLET | Freq: Every day | ORAL | 3 refills | Status: DC
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
## 2018-01-27 ENCOUNTER — Other Ambulatory Visit: Payer: Self-pay | Admitting: Physician Assistant

## 2018-02-19 ENCOUNTER — Other Ambulatory Visit: Payer: Self-pay | Admitting: Physician Assistant

## 2018-03-22 ENCOUNTER — Ambulatory Visit (INDEPENDENT_AMBULATORY_CARE_PROVIDER_SITE_OTHER): Payer: PRIVATE HEALTH INSURANCE | Admitting: Orthopedic Surgery

## 2018-03-22 ENCOUNTER — Ambulatory Visit (INDEPENDENT_AMBULATORY_CARE_PROVIDER_SITE_OTHER): Payer: PRIVATE HEALTH INSURANCE

## 2018-03-22 ENCOUNTER — Encounter (INDEPENDENT_AMBULATORY_CARE_PROVIDER_SITE_OTHER): Payer: Self-pay | Admitting: Orthopedic Surgery

## 2018-03-22 DIAGNOSIS — M25562 Pain in left knee: Secondary | ICD-10-CM | POA: Diagnosis not present
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
## 2018-05-21 ENCOUNTER — Ambulatory Visit (INDEPENDENT_AMBULATORY_CARE_PROVIDER_SITE_OTHER): Payer: PRIVATE HEALTH INSURANCE | Admitting: Family

## 2018-05-21 ENCOUNTER — Encounter: Payer: Self-pay | Admitting: Family

## 2018-05-21 VITALS — BP 150/93 | HR 78 | Temp 98.1°F | Ht 69.0 in | Wt 238.6 lb

## 2018-05-21 DIAGNOSIS — R6889 Other general symptoms and signs: Secondary | ICD-10-CM | POA: Diagnosis not present

## 2018-05-21 LAB — VERITOR FLU A/B WAIVED
INFLUENZA A: NEGATIVE
INFLUENZA B: NEGATIVE

## 2018-05-21 MED ORDER — OSELTAMIVIR PHOSPHATE 75 MG PO CAPS: 75.0000 mg | ORAL_CAPSULE | Freq: Two times a day (BID) | ORAL | 0 refills | Status: DC

## 2018-05-21 NOTE — Patient Instructions (Signed)

## 2018-05-21 NOTE — Progress Notes (Signed)
   Subjective:    Patient ID: [name], male    DOB: 06-07-1961, 57 y.o.   MRN: [id number]  Chief Complaint  Patient presents with  . head congestion  . Generalized Body Aches  . Headache    Cough  This is a new problem. The current episode started in the past 7 days. The problem has been rapidly worsening. The problem occurs every few minutes. The cough is non-productive. Associated symptoms include chills, ear congestion, ear pain, a fever, headaches, myalgias, nasal congestion, postnasal drip, a sore throat, shortness of breath and wheezing. Pertinent negatives include no rhinorrhea. He has tried rest and OTC cough suppressant for the symptoms. The treatment provided mild relief.      Review of Systems  Constitutional: Positive for chills and fever.  HENT: Positive for ear pain, postnasal drip and sore throat. Negative for rhinorrhea.   Respiratory: Positive for cough, shortness of breath and wheezing.   Musculoskeletal: Positive for myalgias.  Neurological: Positive for headaches.  All other systems reviewed and are negative.      Objective:   Physical Exam  Constitutional: He is oriented to person, place, and time. He appears well-developed and well-nourished. He appears ill. No distress.  HENT:  Head: Normocephalic.  Right Ear: External ear normal.  Left Ear: External ear normal.  Nose: Mucosal edema and rhinorrhea present.  Mouth/Throat: Posterior oropharyngeal erythema present.  Eyes: Pupils are equal, round, and reactive to light. Right eye exhibits no discharge. Left eye exhibits no discharge.  Neck: Normal range of motion. Neck supple. No thyromegaly present.  Cardiovascular: Normal rate, regular rhythm, normal heart sounds and intact distal pulses.  No murmur heard. Pulmonary/Chest: Effort normal and breath sounds normal. No respiratory distress. He has no wheezes.  Dry intermittent nonproductive cough  Abdominal: Soft. Bowel sounds are normal. He  exhibits no distension. There is no tenderness.  Musculoskeletal: Normal range of motion. He exhibits no edema or tenderness.  Neurological: He is alert and oriented to person, place, and time. He has normal reflexes. No cranial nerve deficit.  Skin: Skin is warm and dry. No rash noted. No erythema.  Psychiatric: He has a normal mood and affect. His behavior is normal. Judgment and thought content normal.  Vitals reviewed.     BP (!) 150/93   Pulse 78   Temp 98.1 F (36.7 C) (Oral)   Ht 5\' 9"  (1.753 m)   Wt 238 lb 9.6 oz (108.2 kg)   BMI 35.24 kg/m      Assessment & Plan:  [name] comes in today with chief complaint of head congestion; Generalized Body Aches; and Headache   Diagnosis and orders addressed:  1. Flu-like symptoms Force fluids Good hand hygiene  Tylenol or Motrin prn for pain and fevers RTO if symptoms worsen or do not improve  - Veritor Flu A/B Waived - oseltamivir (TAMIFLU) 75 MG capsule; Take 1 capsule (75 mg total) by mouth 2 (two) times daily.  Dispense: 10 capsule; Refill: 0   Jannifer Rodney, FNP

## 2018-05-24 ENCOUNTER — Ambulatory Visit (INDEPENDENT_AMBULATORY_CARE_PROVIDER_SITE_OTHER): Payer: PRIVATE HEALTH INSURANCE

## 2018-05-24 ENCOUNTER — Ambulatory Visit (INDEPENDENT_AMBULATORY_CARE_PROVIDER_SITE_OTHER): Payer: PRIVATE HEALTH INSURANCE | Admitting: Family Medicine

## 2018-05-24 ENCOUNTER — Encounter: Payer: Self-pay | Admitting: Family Medicine

## 2018-05-24 VITALS — BP 148/88 | HR 63 | Temp 97.5°F | Ht 69.0 in | Wt 237.0 lb

## 2018-05-24 DIAGNOSIS — R0602 Shortness of breath: Secondary | ICD-10-CM

## 2018-05-24 DIAGNOSIS — R059 Cough, unspecified: Secondary | ICD-10-CM

## 2018-05-24 DIAGNOSIS — J329 Chronic sinusitis, unspecified: Secondary | ICD-10-CM | POA: Diagnosis not present

## 2018-05-24 DIAGNOSIS — R05 Cough: Secondary | ICD-10-CM

## 2018-05-24 DIAGNOSIS — J40 Bronchitis, not specified as acute or chronic: Secondary | ICD-10-CM | POA: Diagnosis not present

## 2018-05-24 MED ORDER — AMOXICILLIN-POT CLAVULANATE 875-125 MG PO TABS: 1.0000 | ORAL_TABLET | Freq: Two times a day (BID) | ORAL | 0 refills | Status: DC
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
## 2018-05-24 NOTE — Patient Instructions (Signed)
Personal review of your x-ray did not demonstrate anything suggestive of pneumonia.  I am awaiting the formal reviewed by radiology will contact you soon as I have that.  I have sent in Augmentin for you to take twice a day for the next 10 days.  If your symptoms worsen or you develop any other worsening symptoms or signs we discussed, please seek medical reevaluation.

## 2018-06-16 ENCOUNTER — Ambulatory Visit (INDEPENDENT_AMBULATORY_CARE_PROVIDER_SITE_OTHER): Payer: PRIVATE HEALTH INSURANCE | Admitting: Family Medicine

## 2018-06-16 ENCOUNTER — Encounter: Payer: Self-pay | Admitting: Family Medicine

## 2018-06-16 VITALS — BP 124/80 | HR 82 | Temp 97.3°F | Ht 69.0 in | Wt 235.0 lb

## 2018-06-16 DIAGNOSIS — R058 Other specified cough: Secondary | ICD-10-CM

## 2018-06-16 DIAGNOSIS — R05 Cough: Secondary | ICD-10-CM

## 2018-06-16 DIAGNOSIS — R0609 Other forms of dyspnea: Secondary | ICD-10-CM | POA: Diagnosis not present

## 2018-06-16 DIAGNOSIS — J40 Bronchitis, not specified as acute or chronic: Secondary | ICD-10-CM

## 2018-06-16 DIAGNOSIS — R0600 Dyspnea, unspecified: Secondary | ICD-10-CM

## 2018-06-16 MED ORDER — PREDNISONE 20 MG PO TABS: 40.0000 mg | ORAL_TABLET | Freq: Every day | ORAL | 0 refills | Status: AC

## 2018-06-16 MED ORDER — AZITHROMYCIN 250 MG PO TABS: ORAL_TABLET | ORAL | 0 refills | Status: DC

## 2018-06-16 MED ORDER — METHYLPREDNISOLONE ACETATE 80 MG/ML IJ SUSP
80.0000 mg | Freq: Once | INTRAMUSCULAR | Status: AC
  Administered 2018-06-16: 80 mg via INTRAMUSCULAR

## 2018-06-16 MED ORDER — BENZONATATE 100 MG PO CAPS: 100.0000 mg | ORAL_CAPSULE | Freq: Three times a day (TID) | ORAL | 0 refills | Status: DC | PRN

## 2018-06-16 NOTE — Patient Instructions (Signed)
I am starting you on a different antibiotic to cover for any atypical bacterial infection called azithromycin.  You can start this today or tomorrow.  You are given a dose of steroid by injection today to help with any inflammation within the lungs.  Benzonatate has been prescribed to take up to 3 times daily if needed for cough.  We are also checking blood and will have those results in the next couple of days.  If your symptoms worsen or you develop any other worrisome symptoms or signs we discussed, please seek immediate medical attention.   Acute Bronchitis, Adult Acute bronchitis is sudden (acute) swelling of the air tubes (bronchi) in the lungs. Acute bronchitis causes these tubes to fill with mucus, which can make it hard to breathe. It can also cause coughing or wheezing. In adults, acute bronchitis usually goes away within 2 weeks. A cough caused by bronchitis may last up to 3 weeks. Smoking, allergies, and asthma can make the condition worse. Repeated episodes of bronchitis may cause further lung problems, such as chronic obstructive pulmonary disease (COPD). What are the causes? This condition can be caused by germs and by substances that irritate the lungs, including:  Cold and flu viruses. This condition is most often caused by the same virus that causes a cold.  Bacteria.  Exposure to tobacco smoke, dust, fumes, and air pollution.  What increases the risk? This condition is more likely to develop in people who:  Have close contact with someone with acute bronchitis.  Are exposed to lung irritants, such as tobacco smoke, dust, fumes, and vapors.  Have a weak immune system.  Have a respiratory condition such as asthma.  What are the signs or symptoms? Symptoms of this condition include:  A cough.  Coughing up clear, yellow, or green mucus.  Wheezing.  Chest congestion.  Shortness of breath.  A fever.  Body aches.  Chills.  A sore throat.  How is this  diagnosed? This condition is usually diagnosed with a physical exam. During the exam, your health care provider may order tests, such as chest X-rays, to rule out other conditions. He or she may also:  Test a sample of your mucus for bacterial infection.  Check the level of oxygen in your blood. This is done to check for pneumonia.  Do a chest X-ray or lung function testing to rule out pneumonia and other conditions.  Perform blood tests.  Your health care provider will also ask about your symptoms and medical history. How is this treated? Most cases of acute bronchitis clear up over time without treatment. Your health care provider may recommend:  Drinking more fluids. Drinking more makes your mucus thinner, which may make it easier to breathe.  Taking a medicine for a fever or cough.  Taking an antibiotic medicine.  Using an inhaler to help improve shortness of breath and to control a cough.  Using a cool mist vaporizer or humidifier to make it easier to breathe.  Follow these instructions at home: Medicines  Take over-the-counter and prescription medicines only as told by your health care provider.  If you were prescribed an antibiotic, take it as told by your health care provider. Do not stop taking the antibiotic even if you start to feel better. General instructions  Get plenty of rest.  Drink enough fluids to keep your urine clear or pale yellow.  Avoid smoking and secondhand smoke. Exposure to cigarette smoke or irritating chemicals will make bronchitis worse. If you  smoke and you need help quitting, ask your health care provider. Quitting smoking will help your lungs heal faster.  Use an inhaler, cool mist vaporizer, or humidifier as told by your health care provider.  Keep all follow-up visits as told by your health care provider. This is important. How is this prevented? To lower your risk of getting this condition again:  Wash your hands often with soap and  water. If soap and water are not available, use hand sanitizer.  Avoid contact with people who have cold symptoms.  Try not to touch your hands to your mouth, nose, or eyes.  Make sure to get the flu shot every year.  Contact a health care provider if:  Your symptoms do not improve in 2 weeks of treatment. Get help right away if:  You cough up blood.  You have chest pain.  You have severe shortness of breath.  You become dehydrated.  You faint or keep feeling like you are going to faint.  You keep vomiting.  You have a severe headache.  Your fever or chills gets worse. This information is not intended to replace advice given to you by your health care provider. Make sure you discuss any questions you have with your health care provider. Document Released: 08/28/2004 Document Revised: 02/13/2016 Document Reviewed: 01/09/2016 Elsevier Interactive Patient Education  [employer].
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
## 2018-06-17 ENCOUNTER — Telehealth: Payer: Self-pay | Admitting: Physician Assistant

## 2018-06-17 LAB — BASIC METABOLIC PANEL
BUN / CREAT RATIO: 8 — AB (ref 9–20)
BUN: 8 mg/dL (ref 6–24)
CHLORIDE: 100 mmol/L (ref 96–106)
CO2: 21 mmol/L (ref 20–29)
Calcium: 10 mg/dL (ref 8.7–10.2)
Creatinine, Ser: 0.97 mg/dL (ref 0.76–1.27)
GFR calc non Af Amer: 88 mL/min/{1.73_m2} (ref >59)
GFR, EST AFRICAN AMERICAN: 102 mL/min/{1.73_m2} (ref >59)
Glucose: 84 mg/dL (ref 65–99)
POTASSIUM: 4.1 mmol/L (ref 3.5–5.2)
Sodium: 138 mmol/L (ref 134–144)

## 2018-06-17 LAB — CBC
HEMATOCRIT: 44.3 % (ref 37.5–51.0)
Hemoglobin: 15.4 g/dL (ref 13.0–17.7)
MCH: 27.3 pg (ref 26.6–33.0)
MCHC: 34.8 g/dL (ref 31.5–35.7)
MCV: 78 fL — ABNORMAL LOW (ref 79–97)
Platelets: 323 10*3/uL (ref 150–450)
RBC: 5.65 x10E6/uL (ref 4.14–5.80)
RDW: 13.3 % (ref 12.3–15.4)
WBC: 9.2 10*3/uL (ref 3.4–10.8)

## 2018-06-17 LAB — BRAIN NATRIURETIC PEPTIDE

## 2018-06-17 NOTE — Telephone Encounter (Signed)
Labs were normal.  Awaiting BNP (heart lab).

## 2018-06-17 NOTE — Telephone Encounter (Signed)
Patient aware.

## 2018-08-18 ENCOUNTER — Other Ambulatory Visit: Payer: Self-pay | Admitting: Physician Assistant

## 2018-08-18 ENCOUNTER — Telehealth: Payer: Self-pay | Admitting: Physician Assistant

## 2018-08-19 ENCOUNTER — Other Ambulatory Visit: Payer: Self-pay | Admitting: Physician Assistant

## 2018-08-19 DIAGNOSIS — Z0000 Encounter for general adult medical examination without abnormal findings: Secondary | ICD-10-CM

## 2018-08-19 NOTE — Telephone Encounter (Signed)
placed

## 2018-08-20 ENCOUNTER — Encounter: Payer: Self-pay | Admitting: Family Medicine

## 2018-08-20 ENCOUNTER — Ambulatory Visit: Payer: 59 | Admitting: Family Medicine

## 2018-08-20 VITALS — BP 132/77 | HR 66 | Temp 97.6°F | Ht 69.0 in | Wt 236.0 lb

## 2018-08-20 DIAGNOSIS — J069 Acute upper respiratory infection, unspecified: Secondary | ICD-10-CM | POA: Diagnosis not present

## 2018-08-20 DIAGNOSIS — H66002 Acute suppurative otitis media without spontaneous rupture of ear drum, left ear: Secondary | ICD-10-CM | POA: Diagnosis not present

## 2018-08-20 MED ORDER — PSEUDOEPH-BROMPHEN-DM 30-2-10 MG/5ML PO SYRP: 5.0000 mL | ORAL_SOLUTION | Freq: Four times a day (QID) | ORAL | 0 refills | Status: DC | PRN

## 2018-08-20 MED ORDER — AMOXICILLIN 875 MG PO TABS: 875.0000 mg | ORAL_TABLET | Freq: Two times a day (BID) | ORAL | 0 refills | Status: DC

## 2018-08-20 NOTE — Patient Instructions (Signed)
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
## 2018-09-04 ENCOUNTER — Other Ambulatory Visit: Payer: 59

## 2018-09-04 DIAGNOSIS — R1031 Right lower quadrant pain: Secondary | ICD-10-CM

## 2018-09-04 DIAGNOSIS — Z0000 Encounter for general adult medical examination without abnormal findings: Secondary | ICD-10-CM | POA: Diagnosis not present

## 2018-09-05 LAB — CBC WITH DIFFERENTIAL/PLATELET
Basophils Absolute: 0 10*3/uL (ref 0.0–0.2)
Basos: 1 %
EOS (ABSOLUTE): 0.3 10*3/uL (ref 0.0–0.4)
Eos: 4 %
HEMOGLOBIN: 15.2 g/dL (ref 13.0–17.7)
Hematocrit: 43.9 % (ref 37.5–51.0)
IMMATURE GRANS (ABS): 0 10*3/uL (ref 0.0–0.1)
Immature Granulocytes: 0 %
LYMPHS: 37 %
Lymphocytes Absolute: 2.6 10*3/uL (ref 0.7–3.1)
MCH: 27.5 pg (ref 26.6–33.0)
MCHC: 34.6 g/dL (ref 31.5–35.7)
MCV: 80 fL (ref 79–97)
Monocytes Absolute: 0.5 10*3/uL (ref 0.1–0.9)
Monocytes: 7 %
NEUTROS PCT: 51 %
Neutrophils Absolute: 3.5 10*3/uL (ref 1.4–7.0)
Platelets: 324 10*3/uL (ref 150–450)
RBC: 5.52 x10E6/uL (ref 4.14–5.80)
RDW: 13.6 % (ref 11.6–15.4)
WBC: 7 10*3/uL (ref 3.4–10.8)

## 2018-09-05 LAB — CMP14+EGFR
ALT: 20 IU/L (ref 0–44)
AST: 17 IU/L (ref 0–40)
Albumin/Globulin Ratio: 1.9 (ref 1.2–2.2)
Albumin: 4.2 g/dL (ref 3.8–4.9)
Alkaline Phosphatase: 91 IU/L (ref 39–117)
BUN/Creatinine Ratio: 11 (ref 9–20)
BUN: 11 mg/dL (ref 6–24)
Bilirubin Total: 0.4 mg/dL (ref 0.0–1.2)
CO2: 24 mmol/L (ref 20–29)
CREATININE: 1.03 mg/dL (ref 0.76–1.27)
Calcium: 9.6 mg/dL (ref 8.7–10.2)
Chloride: 102 mmol/L (ref 96–106)
GFR calc Af Amer: 94 mL/min/{1.73_m2} (ref >59)
GFR calc non Af Amer: 81 mL/min/{1.73_m2} (ref >59)
GLUCOSE: 99 mg/dL (ref 65–99)
Globulin, Total: 2.2 g/dL (ref 1.5–4.5)
Potassium: 4.4 mmol/L (ref 3.5–5.2)
Sodium: 141 mmol/L (ref 134–144)
TOTAL PROTEIN: 6.4 g/dL (ref 6.0–8.5)

## 2018-09-05 LAB — LIPID PANEL
Chol/HDL Ratio: 5 ratio (ref 0.0–5.0)
Cholesterol, Total: 203 mg/dL — ABNORMAL HIGH (ref 100–199)
HDL: 41 mg/dL (ref >39)
LDL Calculated: 135 mg/dL — ABNORMAL HIGH (ref 0–99)
Triglycerides: 135 mg/dL (ref 0–149)
VLDL Cholesterol Cal: 27 mg/dL (ref 5–40)

## 2018-09-05 LAB — PSA: PROSTATE SPECIFIC AG, SERUM: 0.9 ng/mL (ref 0.0–4.0)

## 2018-09-05 LAB — TSH: TSH: 1.02 u[IU]/mL (ref 0.450–4.500)

## 2018-09-06 ENCOUNTER — Ambulatory Visit (INDEPENDENT_AMBULATORY_CARE_PROVIDER_SITE_OTHER): Payer: 59 | Admitting: Physician Assistant

## 2018-09-06 ENCOUNTER — Encounter: Payer: Self-pay | Admitting: Physician Assistant

## 2018-09-06 VITALS — BP 133/83 | HR 77 | Temp 96.9°F | Ht 69.0 in | Wt 238.4 lb

## 2018-09-06 DIAGNOSIS — Z0000 Encounter for general adult medical examination without abnormal findings: Secondary | ICD-10-CM

## 2018-09-06 DIAGNOSIS — N401 Enlarged prostate with lower urinary tract symptoms: Secondary | ICD-10-CM | POA: Diagnosis not present

## 2018-09-06 DIAGNOSIS — R350 Frequency of micturition: Secondary | ICD-10-CM

## 2018-09-06 DIAGNOSIS — J301 Allergic rhinitis due to pollen: Secondary | ICD-10-CM | POA: Insufficient documentation

## 2018-09-06 DIAGNOSIS — Z1211 Encounter for screening for malignant neoplasm of colon: Secondary | ICD-10-CM

## 2018-09-06 DIAGNOSIS — Z0001 Encounter for general adult medical examination with abnormal findings: Secondary | ICD-10-CM

## 2018-09-06 MED ORDER — TAMSULOSIN HCL 0.4 MG PO CAPS: 0.4000 mg | ORAL_CAPSULE | Freq: Every day | ORAL | 3 refills | Status: DC

## 2018-09-06 MED ORDER — DESLORATADINE 5 MG PO TABS: 5.0000 mg | ORAL_TABLET | Freq: Every day | ORAL | 3 refills | Status: DC

## 2018-09-06 MED ORDER — PANTOPRAZOLE SODIUM 40 MG PO TBEC: 40.0000 mg | DELAYED_RELEASE_TABLET | Freq: Every day | ORAL | 3 refills | Status: DC

## 2018-09-06 MED ORDER — METOPROLOL TARTRATE 25 MG PO TABS: 12.5000 mg | ORAL_TABLET | Freq: Two times a day (BID) | ORAL | 5 refills | Status: DC

## 2018-09-06 MED ORDER — OLMESARTAN MEDOXOMIL 40 MG PO TABS: 40.0000 mg | ORAL_TABLET | Freq: Every day | ORAL | 3 refills | Status: DC

## 2018-09-06 MED ORDER — FLUTICASONE PROPIONATE 50 MCG/ACT NA SUSP: 2.0000 | Freq: Every day | NASAL | 6 refills | Status: DC

## 2018-09-06 MED ORDER — MONTELUKAST SODIUM 10 MG PO TABS: 10.0000 mg | ORAL_TABLET | Freq: Every day | ORAL | 3 refills | Status: DC

## 2018-09-06 NOTE — Patient Instructions (Signed)
Health Maintenance After Age 56 After age 56, you are at a higher risk for certain long-term diseases and infections as well as injuries from falls. Falls are a major cause of broken bones and head injuries in people who are older than age 56. Getting regular preventive care can help to keep you healthy and well. Preventive care includes getting regular testing and making lifestyle changes as recommended by your health care provider. Talk with your health care provider about:  Which screenings and tests you should have. A screening is a test that checks for a disease when you have no symptoms.  A diet and exercise plan that is right for you. What should I know about screenings and tests to prevent falls? Screening and testing are the best ways to find a health problem early. Early diagnosis and treatment give you the best chance of managing medical conditions that are common after age 56. Certain conditions and lifestyle choices may make you more likely to have a fall. Your health care provider may recommend:  Regular vision checks. Poor vision and conditions such as cataracts can make you more likely to have a fall. If you wear glasses, make sure to get your prescription updated if your vision changes.  Medicine review. Work with your health care provider to regularly review all of the medicines you are taking, including over-the-counter medicines. Ask your health care provider about any side effects that may make you more likely to have a fall. Tell your health care provider if any medicines that you take make you feel dizzy or sleepy.  Osteoporosis screening. Osteoporosis is a condition that causes the bones to get weaker. This can make the bones weak and cause them to break more easily.  Blood pressure screening. Blood pressure changes and medicines to control blood pressure can make you feel dizzy.  Strength and balance checks. Your health care provider may recommend certain tests to check your  strength and balance while standing, walking, or changing positions.  Foot health exam. Foot pain and numbness, as well as not wearing proper footwear, can make you more likely to have a fall.  Depression screening. You may be more likely to have a fall if you have a fear of falling, feel emotionally low, or feel unable to do activities that you used to do.  Alcohol use screening. Using too much alcohol can affect your balance and may make you more likely to have a fall. What actions can I take to lower my risk of falls? General instructions  Talk with your health care provider about your risks for falling. Tell your health care provider if: ? You fall. Be sure to tell your health care provider about all falls, even ones that seem minor. ? You feel dizzy, sleepy, or off-balance.  Take over-the-counter and prescription medicines only as told by your health care provider. These include any supplements.  Eat a healthy diet and maintain a healthy weight. A healthy diet includes low-fat dairy products, low-fat (lean) meats, and fiber from whole grains, beans, and lots of fruits and vegetables. Home safety  Remove any tripping hazards, such as rugs, cords, and clutter.  Install safety equipment such as grab bars in bathrooms and safety rails on stairs.  Keep rooms and walkways well-lit. Activity   Follow a regular exercise program to stay fit. This will help you maintain your balance. Ask your health care provider what types of exercise are appropriate for you.  If you need a cane or   walker, use it as recommended by your health care provider.  Wear supportive shoes that have nonskid soles. Lifestyle  Do not drink alcohol if your health care provider tells you not to drink.  If you drink alcohol, limit how much you have: ? 0-1 drink a day for women. ? 0-2 drinks a day for men.  Be aware of how much alcohol is in your drink. In the [place] one drink equals one typical bottle of beer (12  oz), one-half glass of wine (5 oz), or one shot of hard liquor (1 oz).  Do not use any products that contain nicotine or tobacco, such as cigarettes and e-cigarettes. If you need help quitting, ask your health care provider. Summary  Having a healthy lifestyle and getting preventive care can help to protect your health and wellness after age 56.  Screening and testing are the best way to find a health problem early and help you avoid having a fall. Early diagnosis and treatment give you the best chance for managing medical conditions that are more common for people who are older than age 56.  Falls are a major cause of broken bones and head injuries in people who are older than age 56. Take precautions to prevent a fall at home.  Work with your health care provider to learn what changes you can make to improve your health and wellness and to prevent falls. This information is not intended to replace advice given to you by your health care provider. Make sure you discuss any questions you have with your health care provider. Document Released: 06/03/2017 Document Revised: 06/03/2017 Document Reviewed: 06/03/2017 Elsevier Interactive Patient Education  2019 Elsevier Inc.  
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
## 2018-09-07 LAB — BAYER DCA HB A1C WAIVED: HB A1C (BAYER DCA - WAIVED): 5.8 % (ref <7.0)

## 2018-09-18 DIAGNOSIS — Z1211 Encounter for screening for malignant neoplasm of colon: Secondary | ICD-10-CM | POA: Diagnosis not present

## 2018-09-22 LAB — COLOGUARD: Cologuard: NEGATIVE

## 2018-10-20 ENCOUNTER — Telehealth: Payer: Self-pay | Admitting: Physician Assistant

## 2018-10-20 NOTE — Telephone Encounter (Signed)
PT has called and states that the pharmacy told him that the desloratadine (CLARINEX) 5 MG tablet is needing a prior auth. He has tried others in past and they don't work, he recently has changed insurance through employer and this is first time using this insurance to fill this rx   Pharmacy: [facility].

## 2018-10-21 NOTE — Telephone Encounter (Signed)
I dont have this prior auth but will call pharmacy about it

## 2018-10-25 ENCOUNTER — Telehealth: Payer: Self-pay

## 2018-10-25 ENCOUNTER — Other Ambulatory Visit: Payer: Self-pay | Admitting: Physician Assistant

## 2018-10-25 MED ORDER — LEVOCETIRIZINE DIHYDROCHLORIDE 5 MG PO TABS: 5.0000 mg | ORAL_TABLET | Freq: Every evening | ORAL | 3 refills | Status: DC

## 2018-10-25 NOTE — Telephone Encounter (Signed)
No it was [facility] that sent prior auth

## 2018-10-25 NOTE — Telephone Encounter (Signed)
[facility]?

## 2018-10-25 NOTE — Telephone Encounter (Signed)
I sent generic xyzal yo [id number], is that the right pharmacy?

## 2018-10-25 NOTE — Progress Notes (Signed)
xyzal

## 2018-10-25 NOTE — Telephone Encounter (Signed)
Insurance denied prior auth for Desloratadin   Must fail levocetirizine first

## 2018-10-27 NOTE — Telephone Encounter (Signed)
I did it.

## 2018-10-27 NOTE — Telephone Encounter (Signed)
No  Do you want me to do that?

## 2018-11-29 ENCOUNTER — Ambulatory Visit (INDEPENDENT_AMBULATORY_CARE_PROVIDER_SITE_OTHER): Payer: 59 | Admitting: Family Medicine

## 2018-11-29 ENCOUNTER — Other Ambulatory Visit: Payer: Self-pay

## 2018-11-29 DIAGNOSIS — R6889 Other general symptoms and signs: Secondary | ICD-10-CM

## 2018-11-29 DIAGNOSIS — Z20822 Contact with and (suspected) exposure to covid-19: Secondary | ICD-10-CM

## 2018-11-29 MED ORDER — ALBUTEROL SULFATE HFA 108 (90 BASE) MCG/ACT IN AERS: 2.0000 | INHALATION_SPRAY | Freq: Four times a day (QID) | RESPIRATORY_TRACT | 0 refills | Status: DC | PRN

## 2018-11-29 MED ORDER — BENZONATATE 100 MG PO CAPS: 100.0000 mg | ORAL_CAPSULE | Freq: Three times a day (TID) | ORAL | 0 refills | Status: DC | PRN
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
## 2018-11-29 NOTE — Patient Instructions (Signed)
Person Under Monitoring Name: [name]  Location: [address] [place] [address]   Infection Prevention Recommendations for Individuals Confirmed to have, or Being Evaluated for, 2019 Novel Coronavirus (COVID-19) Infection Who Receive Care at Home  Individuals who are confirmed to have, or are being evaluated for, COVID-19 should follow the prevention steps below until a healthcare provider or local or state health department says they can return to normal activities.  Stay home except to get medical care You should restrict activities outside your home, except for getting medical care. Do not go to work, school, or public areas, and do not use public transportation or taxis.  Call ahead before visiting your doctor Before your medical appointment, call the healthcare provider and tell them that you have, or are being evaluated for, COVID-19 infection. This will help the healthcare provider's office take steps to keep other people from getting infected. Ask your healthcare provider to call the local or state health department.  Monitor your symptoms Seek prompt medical attention if your illness is worsening (e.g., difficulty breathing). Before going to your medical appointment, call the healthcare provider and tell them that you have, or are being evaluated for, COVID-19 infection. Ask your healthcare provider to call the local or state health department.  Wear a facemask You should wear a facemask that covers your nose and mouth when you are in the same room with other people and when you visit a healthcare provider. People who live with or visit you should also wear a facemask while they are in the same room with you.  Separate yourself from other people in your home As much as possible, you should stay in a different room from other people in your home. Also, you should use a separate bathroom, if available.  Avoid sharing household items You should  not share dishes, drinking glasses, cups, eating utensils, towels, bedding, or other items with other people in your home. After using these items, you should wash them thoroughly with soap and water.  Cover your coughs and sneezes Cover your mouth and nose with a tissue when you cough or sneeze, or you can cough or sneeze into your sleeve. Throw used tissues in a lined trash can, and immediately wash your hands with soap and water for at least 20 seconds or use an alcohol-based hand rub.  Wash your [employer] your hands often and thoroughly with soap and water for at least 20 seconds. You can use an alcohol-based hand sanitizer if soap and water are not available and if your hands are not visibly dirty. Avoid touching your eyes, nose, and mouth with unwashed hands.   Prevention Steps for Caregivers and Household Members of Individuals Confirmed to have, or Being Evaluated for, COVID-19 Infection Being Cared for in the Home  If you live with, or provide care at home for, a person confirmed to have, or being evaluated for, COVID-19 infection please follow these guidelines to prevent infection:  Follow healthcare provider's instructions Make sure that you understand and can help the patient follow any healthcare provider instructions for all care.  Provide for the patient's basic needs You should help the patient with basic needs in the home and provide support for getting groceries, prescriptions, and other personal needs.  Monitor the patient's symptoms If they are getting sicker, call his or her medical provider and tell them that the patient has, or is being evaluated for, COVID-19 infection. This will help the healthcare provider's  office take steps to keep other people from getting infected. Ask the healthcare provider to call the local or state health department.  Limit the number of people who have contact with the patient  If possible, have only one caregiver for the patient.   Other household members should stay in another home or place of residence. If this is not possible, they should stay  in another room, or be separated from the patient as much as possible. Use a separate bathroom, if available.  Restrict visitors who do not have an essential need to be in the home.  Keep older adults, very young children, and other sick people away from the patient Keep older adults, very young children, and those who have compromised immune systems or chronic health conditions away from the patient. This includes people with chronic heart, lung, or kidney conditions, diabetes, and cancer.  Ensure good ventilation Make sure that shared spaces in the home have good air flow, such as from an air conditioner or an opened window, weather permitting.  Wash your hands often  Wash your hands often and thoroughly with soap and water for at least 20 seconds. You can use an alcohol based hand sanitizer if soap and water are not available and if your hands are not visibly dirty.  Avoid touching your eyes, nose, and mouth with unwashed hands.  Use disposable paper towels to dry your hands. If not available, use dedicated cloth towels and replace them when they become wet.  Wear a facemask and gloves  Wear a disposable facemask at all times in the room and gloves when you touch or have contact with the patient's blood, body fluids, and/or secretions or excretions, such as sweat, saliva, sputum, nasal mucus, vomit, urine, or feces.  Ensure the mask fits over your nose and mouth tightly, and do not touch it during use.  Throw out disposable facemasks and gloves after using them. Do not reuse.  Wash your hands immediately after removing your facemask and gloves.  If your personal clothing becomes contaminated, carefully remove clothing and launder. Wash your hands after handling contaminated clothing.  Place all used disposable facemasks, gloves, and other waste in a lined  container before disposing them with other household waste.  Remove gloves and wash your hands immediately after handling these items.  Do not share dishes, glasses, or other household items with the patient  Avoid sharing household items. You should not share dishes, drinking glasses, cups, eating utensils, towels, bedding, or other items with a patient who is confirmed to have, or being evaluated for, COVID-19 infection.  After the person uses these items, you should wash them thoroughly with soap and water.  Wash laundry thoroughly  Immediately remove and wash clothes or bedding that have blood, body fluids, and/or secretions or excretions, such as sweat, saliva, sputum, nasal mucus, vomit, urine, or feces, on them.  Wear gloves when handling laundry from the patient.  Read and follow directions on labels of laundry or clothing items and detergent. In general, wash and dry with the warmest temperatures recommended on the label.  Clean all areas the individual has used often  Clean all touchable surfaces, such as counters, tabletops, doorknobs, bathroom fixtures, toilets, phones, keyboards, tablets, and bedside tables, every day. Also, clean any surfaces that may have blood, body fluids, and/or secretions or excretions on them.  Wear gloves when cleaning surfaces the patient has come in contact with.  Use a diluted bleach solution (e.g., dilute bleach with 1  part bleach and 10 parts water) or a household disinfectant with a label that says EPA-registered for coronaviruses. To make a bleach solution at home, add 1 tablespoon of bleach to 1 quart (4 cups) of water. For a larger supply, add  cup of bleach to 1 gallon (16 cups) of water.  Read labels of cleaning products and follow recommendations provided on product labels. Labels contain instructions for safe and effective use of the cleaning product including precautions you should take when applying the product, such as wearing gloves or  eye protection and making sure you have good ventilation during use of the product.  Remove gloves and wash hands immediately after cleaning.  Monitor yourself for signs and symptoms of illness Caregivers and household members are considered close contacts, should monitor their health, and will be asked to limit movement outside of the home to the extent possible. Follow the monitoring steps for close contacts listed on the symptom monitoring form.   ? If you have additional questions, contact your local health department or call the epidemiologist on call at [phone] (available 24/7). ? This guidance is subject to change. For the most up-to-date guidance from [facility], please refer to their website: [email or web address].pl

## 2018-12-01 ENCOUNTER — Other Ambulatory Visit: Payer: Self-pay

## 2018-12-01 ENCOUNTER — Ambulatory Visit (INDEPENDENT_AMBULATORY_CARE_PROVIDER_SITE_OTHER): Payer: 59 | Admitting: Physician Assistant

## 2018-12-01 ENCOUNTER — Encounter: Payer: Self-pay | Admitting: Physician Assistant

## 2018-12-01 DIAGNOSIS — R300 Dysuria: Secondary | ICD-10-CM

## 2018-12-01 DIAGNOSIS — R0602 Shortness of breath: Secondary | ICD-10-CM

## 2018-12-01 DIAGNOSIS — R0600 Dyspnea, unspecified: Secondary | ICD-10-CM | POA: Diagnosis not present

## 2018-12-01 DIAGNOSIS — R509 Fever, unspecified: Secondary | ICD-10-CM | POA: Diagnosis not present

## 2018-12-01 MED ORDER — SULFAMETHOXAZOLE-TRIMETHOPRIM 800-160 MG PO TABS: 1.0000 | ORAL_TABLET | Freq: Two times a day (BID) | ORAL | 0 refills | Status: DC
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
## 2019-02-15 ENCOUNTER — Ambulatory Visit (INDEPENDENT_AMBULATORY_CARE_PROVIDER_SITE_OTHER): Payer: 59 | Admitting: Family

## 2019-02-15 ENCOUNTER — Encounter: Payer: Self-pay | Admitting: Family

## 2019-02-15 DIAGNOSIS — M109 Gout, unspecified: Secondary | ICD-10-CM

## 2019-02-15 MED ORDER — COLCHICINE 0.6 MG PO TABS: ORAL_TABLET | ORAL | 1 refills | Status: DC

## 2019-02-15 MED ORDER — INDOMETHACIN 50 MG PO CAPS: ORAL_CAPSULE | ORAL | 1 refills | Status: DC

## 2019-02-15 NOTE — Progress Notes (Signed)
   Virtual Visit via telephone Note  I connected with [name] on 02/15/19 at 12:08 pm by telephone and verified that I am speaking with the correct person using two identifiers. [name] is currently located at work and no one is currently with her during visit. The provider, Evelina Dun, FNP is located in their office at time of visit.  I discussed the limitations, risks, security and privacy concerns of performing an evaluation and management service by telephone and the availability of in person appointments. I also discussed with the patient that there may be a patient responsible charge related to this service. The patient expressed understanding and agreed to proceed.   History and Present Illness:  HPI PT calls today with complaints of left great swelling, redness, and tenderness that started yesterday. States he has gout in the past and feels similar.  He reports the pain is constant throbbing pain of 8 out 10. He can not remember what he has taken the past. He states he does not drink beer.    Review of Systems  Musculoskeletal: Positive for joint pain.  All other systems reviewed and are negative.    Observations/Objective: No SOB or distress noted   Assessment and Plan: 1. Acute gout involving toe, unspecified cause, unspecified laterality Low purine diet Rest Force fluids Keep elevated RTO if symptoms worsen or do not improve  - colchicine 0.6 MG tablet; 1.2 mg then one hour late 0.6 mg. Max 1.8 mg/day  Dispense: 20 tablet; Refill: 1 - indomethacin (INDOCIN) 50 MG capsule; TAKE ONE CAPSULE BY MOUTH 3 TIMES A DAY WITH FOOD OR MILK FOR 10 DAYS  Dispense: 90 capsule; Refill: 1    I discussed the assessment and treatment plan with the patient. The patient was provided an opportunity to ask questions and all were answered. The patient agreed with the plan and demonstrated an understanding of the instructions.   The patient was advised to call back  or seek an in-person evaluation if the symptoms worsen or if the condition fails to improve as anticipated.  The above assessment and management plan was discussed with the patient. The patient verbalized understanding of and has agreed to the management plan. Patient is aware to call the clinic if symptoms persist or worsen. Patient is aware when to return to the clinic for a follow-up visit. Patient educated on when it is appropriate to go to the emergency department.   Time call ended:  12:18 pm  I provided 10 minutes of non-face-to-face time during this encounter.    Evelina Dun, FNP

## 2019-03-12 ENCOUNTER — Other Ambulatory Visit: Payer: Self-pay | Admitting: Physician Assistant

## 2019-03-19 ENCOUNTER — Other Ambulatory Visit: Payer: Self-pay | Admitting: Physician Assistant

## 2019-03-21 ENCOUNTER — Other Ambulatory Visit: Payer: Self-pay | Admitting: Physician Assistant

## 2019-03-21 MED ORDER — LISINOPRIL 20 MG PO TABS: 20.0000 mg | ORAL_TABLET | Freq: Every day | ORAL | 5 refills | Status: DC

## 2019-03-21 NOTE — Telephone Encounter (Signed)
I am sending lisinopril to the pharmacy, which should be much less expensive

## 2019-03-21 NOTE — Telephone Encounter (Signed)
Pharmacy comment:  Alternative Requested: PT PAYING $ 70 FOR OLMES. PT REQUESTING LOWER OUT-OF-POCKET COST.  SEE ALTERNATIVES AND ASSOCIATED COST: LOSAR $51.

## 2019-03-30 ENCOUNTER — Ambulatory Visit: Payer: 59

## 2019-03-30 ENCOUNTER — Encounter: Payer: Self-pay | Admitting: Orthopedic Surgery

## 2019-03-30 ENCOUNTER — Ambulatory Visit (INDEPENDENT_AMBULATORY_CARE_PROVIDER_SITE_OTHER): Payer: 59 | Admitting: Orthopedic Surgery

## 2019-03-30 VITALS — Ht 69.0 in | Wt 232.0 lb

## 2019-03-30 DIAGNOSIS — G8929 Other chronic pain: Secondary | ICD-10-CM

## 2019-03-30 DIAGNOSIS — M545 Low back pain: Secondary | ICD-10-CM

## 2019-03-30 DIAGNOSIS — M25562 Pain in left knee: Secondary | ICD-10-CM

## 2019-03-31 ENCOUNTER — Encounter: Payer: Self-pay | Admitting: Orthopedic Surgery
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
## 2019-04-12 ENCOUNTER — Other Ambulatory Visit: Payer: Self-pay | Admitting: Physician Assistant

## 2019-04-13 NOTE — Telephone Encounter (Signed)
[id number] 9/9 

## 2019-04-13 NOTE — Telephone Encounter (Signed)
Jones. NTBS 30 days given 03/14/19

## 2019-04-15 ENCOUNTER — Ambulatory Visit (INDEPENDENT_AMBULATORY_CARE_PROVIDER_SITE_OTHER): Payer: 59 | Admitting: Physician Assistant

## 2019-04-15 ENCOUNTER — Encounter: Payer: Self-pay | Admitting: Physician Assistant

## 2019-04-15 ENCOUNTER — Other Ambulatory Visit: Payer: Self-pay

## 2019-04-15 DIAGNOSIS — Z20822 Contact with and (suspected) exposure to covid-19: Secondary | ICD-10-CM

## 2019-04-15 DIAGNOSIS — Z20828 Contact with and (suspected) exposure to other viral communicable diseases: Secondary | ICD-10-CM
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
## 2019-04-16 ENCOUNTER — Other Ambulatory Visit: Payer: Self-pay | Admitting: Family

## 2019-04-16 DIAGNOSIS — M109 Gout, unspecified: Secondary | ICD-10-CM

## 2019-04-18 LAB — NOVEL CORONAVIRUS, NAA: SARS-CoV-2, NAA: NOT DETECTED

## 2019-06-08 ENCOUNTER — Other Ambulatory Visit: Payer: Self-pay | Admitting: Physician Assistant

## 2019-06-08 DIAGNOSIS — Z20822 Contact with and (suspected) exposure to covid-19: Secondary | ICD-10-CM

## 2019-06-09 ENCOUNTER — Other Ambulatory Visit: Payer: Self-pay | Admitting: Physician Assistant

## 2019-06-09 MED ORDER — BENZONATATE 200 MG PO CAPS: 200.0000 mg | ORAL_CAPSULE | Freq: Three times a day (TID) | ORAL | 3 refills | Status: DC | PRN

## 2019-06-20 ENCOUNTER — Ambulatory Visit (INDEPENDENT_AMBULATORY_CARE_PROVIDER_SITE_OTHER): Payer: 59 | Admitting: Nurse Practitioner

## 2019-06-20 ENCOUNTER — Other Ambulatory Visit: Payer: Self-pay

## 2019-06-20 ENCOUNTER — Encounter: Payer: Self-pay | Admitting: Nurse Practitioner

## 2019-06-20 DIAGNOSIS — J40 Bronchitis, not specified as acute or chronic: Secondary | ICD-10-CM

## 2019-06-20 MED ORDER — BUDESONIDE-FORMOTEROL FUMARATE 80-4.5 MCG/ACT IN AERO: 2.0000 | INHALATION_SPRAY | Freq: Two times a day (BID) | RESPIRATORY_TRACT | 3 refills | Status: DC

## 2019-06-20 MED ORDER — PREDNISONE 20 MG PO TABS: ORAL_TABLET | ORAL | 0 refills | Status: DC

## 2019-06-20 NOTE — Progress Notes (Signed)
Virtual Visit via telephone Note Due to COVID-19 pandemic this visit was conducted virtually. This visit type was conducted due to national recommendations for restrictions regarding the COVID-19 Pandemic (e.g. social distancing, sheltering in place) in an effort to limit this patient's exposure and mitigate transmission in our community. All issues noted in this document were discussed and addressed.  A physical exam was not performed with this format.  I connected with [name] on 06/20/19 at 2:10 by telephone and verified that I am speaking with the correct person using two identifiers. [name] is currently located at home  and no one is currently with him during visit. The provider, Mary-Margaret Hassell Done, FNP is located in their office at time of visit.  I discussed the limitations, risks, security and privacy concerns of performing an evaluation and management service by telephone and the availability of in person appointments. I also discussed with the patient that there may be a patient responsible charge related to this service. The patient expressed understanding and agreed to proceed.   History and Present Illness:   Chief Complaint: Cough   HPI Patient calls in having a cough for 3-4 weeks. He has tested negative for covid 2 x. He has taking steroid which helped some. cough is worse in the evenings and in the mornings. He does not have a fever and does not smoke.  He went to urgent care at the first of month and was given doxycyccline.  Review of Systems  Constitutional: Negative for chills and fever.  HENT: Positive for congestion. Negative for ear pain.   Respiratory: Positive for cough.   Neurological: Negative for headaches.  Psychiatric/Behavioral: Negative.   All other systems reviewed and are negative.    Observations/Objective: Alert and oriented- answers all questions appropriately mild distress Cough Voice hoarce    Assessment and  Plan: [name] in today with chief complaint of Cough   1. Bronchitis 1. Take meds as prescribed 2. Use a cool mist humidifier especially during the winter months and when heat has been humid. 3. Use saline nose sprays frequently 4. Saline irrigations of the nose can be very helpful if done frequently.  * 4X daily for 1 week*  * Use of a nettie pot can be helpful with this. Follow directions with this* 5. Drink plenty of fluids 6. Keep thermostat turn down low 7.For any cough or congestion- tessalon perles 8. For fever or aces or pains- take tylenol or ibuprofen appropriate for age and weight.  * for fevers greater than 101 orally you may alternate ibuprofen and tylenol every  3 hours.    Meds ordered this encounter  Medications  . budesonide-formoterol (SYMBICORT) 80-4.5 MCG/ACT inhaler    Sig: Inhale 2 puffs into the lungs 2 (two) times daily.    Dispense:  1 Inhaler    Refill:  3    Order Specific Question:   Supervising Provider    Answer:   Caryl Pina A [id number]  . predniSONE (DELTASONE) 20 MG tablet    Sig: 2 po at sametime daily for 5 days    Dispense:  10 tablet    Refill:  0    Order Specific Question:   Supervising Provider    Answer:   Caryl Pina A [[id number]]      Follow Up Instructions: prn    I discussed the assessment and treatment plan with the patient. The patient was provided an opportunity to ask questions and all were answered.  The patient agreed with the plan and demonstrated an understanding of the instructions.   The patient was advised to call back or seek an in-person evaluation if the symptoms worsen or if the condition fails to improve as anticipated.  The above assessment and management plan was discussed with the patient. The patient verbalized understanding of and has agreed to the management plan. Patient is aware to call the clinic if symptoms persist or worsen. Patient is aware when to return to the clinic for a  follow-up visit. Patient educated on when it is appropriate to go to the emergency department.   Time call ended:  2:23  I provided 13 minutes of non-face-to-face time during this encounter.    Mary-Margaret Daphine Deutscher, FNP

## 2019-06-27 ENCOUNTER — Telehealth: Payer: Self-pay | Admitting: Physician Assistant

## 2019-06-27 NOTE — Telephone Encounter (Signed)
Aware.  When shingles reach the scabbing stage ,it is not contagious.  Practice good hygiene, wash clothes and linins with hot water and powder bleaches.

## 2019-06-28 ENCOUNTER — Encounter: Payer: Self-pay | Admitting: Family Medicine

## 2019-06-28 ENCOUNTER — Other Ambulatory Visit: Payer: Self-pay

## 2019-06-28 ENCOUNTER — Ambulatory Visit (INDEPENDENT_AMBULATORY_CARE_PROVIDER_SITE_OTHER): Payer: 59 | Admitting: Family Medicine

## 2019-06-28 DIAGNOSIS — J069 Acute upper respiratory infection, unspecified: Secondary | ICD-10-CM

## 2019-06-28 MED ORDER — PREDNISONE 20 MG PO TABS: ORAL_TABLET | ORAL | 0 refills | Status: DC

## 2019-06-28 MED ORDER — AMOXICILLIN-POT CLAVULANATE 875-125 MG PO TABS: 1.0000 | ORAL_TABLET | Freq: Two times a day (BID) | ORAL | 0 refills | Status: DC

## 2019-06-28 MED ORDER — AMOXICILLIN-POT CLAVULANATE 875-125 MG PO TABS: 1.0000 | ORAL_TABLET | Freq: Two times a day (BID) | ORAL | 0 refills | Status: AC
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
## 2019-07-27 ENCOUNTER — Ambulatory Visit (INDEPENDENT_AMBULATORY_CARE_PROVIDER_SITE_OTHER): Payer: 59 | Admitting: Physician Assistant

## 2019-07-27 DIAGNOSIS — R05 Cough: Secondary | ICD-10-CM | POA: Diagnosis not present

## 2019-07-27 DIAGNOSIS — R059 Cough, unspecified: Secondary | ICD-10-CM

## 2019-07-27 MED ORDER — PREDNISONE 10 MG (48) PO TBPK: ORAL_TABLET | ORAL | 0 refills | Status: DC

## 2019-07-27 MED ORDER — CEFDINIR 300 MG PO CAPS: 300.0000 mg | ORAL_CAPSULE | Freq: Two times a day (BID) | ORAL | 0 refills | Status: DC

## 2019-07-28 ENCOUNTER — Encounter: Payer: Self-pay | Admitting: Physician Assistant
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
## 2019-08-15 ENCOUNTER — Other Ambulatory Visit: Payer: Self-pay

## 2019-08-15 ENCOUNTER — Ambulatory Visit (INDEPENDENT_AMBULATORY_CARE_PROVIDER_SITE_OTHER): Payer: 59 | Admitting: Nurse Practitioner

## 2019-08-15 ENCOUNTER — Encounter: Payer: Self-pay | Admitting: Nurse Practitioner

## 2019-08-15 DIAGNOSIS — R8290 Unspecified abnormal findings in urine: Secondary | ICD-10-CM | POA: Diagnosis not present

## 2019-08-15 NOTE — Progress Notes (Signed)
   Virtual Visit via telephone Note Due to COVID-19 pandemic this visit was conducted virtually. This visit type was conducted due to national recommendations for restrictions regarding the COVID-19 Pandemic (e.g. social distancing, sheltering in place) in an effort to limit this patient's exposure and mitigate transmission in our community. All issues noted in this document were discussed and addressed.  A physical exam was not performed with this format.  I connected with [name] on 08/15/19 at 3:40 by telephone and verified that I am speaking with the correct person using two identifiers. [name] is currently located at home and no one is currently with  him during visit. The provider, Mary-Margaret Daphine Deutscher, FNP is located in their office at time of visit.  I discussed the limitations, risks, security and privacy concerns of performing an evaluation and management service by telephone and the availability of in person appointments. I also discussed with the patient that there may be a patient responsible charge related to this service. The patient expressed understanding and agreed to proceed.   History and Present Illness:   Chief Complaint: Urinary Tract Infection   HPI Patient calls in c/o bil kidney area pain for 2 weeks. He has a history of kidney stones. Right side hurting worse then left side. He does not have any blood in his urine and urine is cloudy and smelling. Feels like he has a fever today   Review of Systems  Constitutional: Negative for chills and fever.  Respiratory: Negative.   Cardiovascular: Negative.   Genitourinary: Positive for flank pain and frequency.  Neurological: Negative.      Observations/Objective: [name] and oriented- answers all questions appropriately moderate distress    Assessment and Plan: [name] in today with chief complaint of Urinary Tract Infection   1. Foul smelling urine According to symtoms-  sounds like kidney infection or kidney stones Force fluids Motrin OTC for pain and or fever Scheduled appointment to be seen tomorrow   Follow Up Instructions: Tomorrow as scheduled    I discussed the assessment and treatment plan with the patient. The patient was provided an opportunity to ask questions and all were answered. The patient agreed with the plan and demonstrated an understanding of the instructions.   The patient was advised to call back or seek an in-person evaluation if the symptoms worsen or if the condition fails to improve as anticipated.  The above assessment and management plan was discussed with the patient. The patient verbalized understanding of and has agreed to the management plan. Patient is aware to call the clinic if symptoms persist or worsen. Patient is aware when to return to the clinic for a follow-up visit. Patient educated on when it is appropriate to go to the emergency department.   Time call ended:  3:54 I provided 14 minutes of non-face-to-face time during this encounter.    Mary-Margaret Daphine Deutscher, FNP

## 2019-08-16 ENCOUNTER — Ambulatory Visit: Payer: 59 | Admitting: Nurse Practitioner

## 2019-08-16 ENCOUNTER — Ambulatory Visit (INDEPENDENT_AMBULATORY_CARE_PROVIDER_SITE_OTHER): Payer: 59

## 2019-08-16 ENCOUNTER — Encounter: Payer: Self-pay | Admitting: Nurse Practitioner

## 2019-08-16 VITALS — BP 129/93 | HR 102 | Temp 99.3°F | Resp 20 | Ht 69.0 in | Wt 239.0 lb

## 2019-08-16 DIAGNOSIS — R319 Hematuria, unspecified: Secondary | ICD-10-CM

## 2019-08-16 DIAGNOSIS — M546 Pain in thoracic spine: Secondary | ICD-10-CM

## 2019-08-16 DIAGNOSIS — R079 Chest pain, unspecified: Secondary | ICD-10-CM | POA: Diagnosis not present

## 2019-08-16 LAB — URINALYSIS, COMPLETE
Bilirubin, UA: NEGATIVE
Glucose, UA: NEGATIVE
Ketones, UA: NEGATIVE
Leukocytes,UA: NEGATIVE
Nitrite, UA: NEGATIVE
Protein,UA: NEGATIVE
RBC, UA: NEGATIVE
Specific Gravity, UA: 1.02 (ref 1.005–1.030)
Urobilinogen, Ur: 0.2 mg/dL (ref 0.2–1.0)
pH, UA: 5.5 (ref 5.0–7.5)

## 2019-08-16 LAB — MICROSCOPIC EXAMINATION
Bacteria, UA: NONE SEEN
Renal Epithel, UA: NONE SEEN /hpf
WBC, UA: NONE SEEN /hpf (ref 0–5)

## 2019-08-16 MED ORDER — PREDNISONE 20 MG PO TABS: ORAL_TABLET | ORAL | 0 refills | Status: DC

## 2019-08-16 MED ORDER — METAXALONE 800 MG PO TABS: 800.0000 mg | ORAL_TABLET | Freq: Three times a day (TID) | ORAL | 1 refills | Status: DC

## 2019-08-16 NOTE — Progress Notes (Signed)
Subjective:    Patient ID: [name], male    DOB: 1961/05/05, 59 y.o.   MRN: [id number]   Chief Complaint: Possible kidney stone and Chest Pain   HPI Patient comes in today c/o mid back pain bil for several days. His urine is strong smelling. He has not seen any blood in urine. Rates back pain 6-7/10. He says he has had several kidney stone in the past , so he can tolerate a lot of pain. He also says last night he was walking his dog and he started having chest pain with increase in heart rates. Slight SOB. Chest pain has eased off now. He denies any heart disease in his family.    Review of Systems  Constitutional: Negative for diaphoresis.  Eyes: Negative for pain.  Respiratory: Negative for shortness of breath.   Cardiovascular: Positive for chest pain. Negative for palpitations and leg swelling.  Gastrointestinal: Negative for abdominal pain.  Endocrine: Negative for polydipsia.  Musculoskeletal: Positive for back pain.  Skin: Negative for rash.  Neurological: Negative for dizziness, weakness and headaches.  Hematological: Does not bruise/bleed easily.  All other systems reviewed and are negative.      Objective:   Physical Exam Vitals and nursing note reviewed.  Constitutional:      Appearance: Normal appearance. He is well-developed.  HENT:     Head: Normocephalic.     Nose: Nose normal.  Eyes:     Pupils: Pupils are equal, round, and reactive to light.  Neck:     Thyroid: No thyroid mass or thyromegaly.     Vascular: No carotid bruit or JVD.     Trachea: Phonation normal.  Cardiovascular:     Rate and Rhythm: Normal rate and regular rhythm.  Pulmonary:     Effort: Pulmonary effort is normal. No respiratory distress.     Breath sounds: Normal breath sounds.  Abdominal:     General: Bowel sounds are normal.     Palpations: Abdomen is soft.     Tenderness: There is no abdominal tenderness. There is no right CVA tenderness or left CVA tenderness.    Musculoskeletal:        General: Normal range of motion.     Cervical back: Normal range of motion and neck supple.     Comments: FROM of thoracic spine without pain No point tenderness (-) SLR bil Motor strength and sensation intact distally  Lymphadenopathy:     Cervical: No cervical adenopathy.  Skin:    General: Skin is warm and dry.  Neurological:     Mental Status: He is alert and oriented to person, place, and time.  Psychiatric:        Behavior: Behavior normal.        Thought Content: Thought content normal.        Judgment: Judgment normal.     EKG- NSR-Mary-Margaret Hassell Done, FNP   BP (!) 129/93   Pulse (!) 102   Temp 99.3 F (37.4 C) (Temporal)   Resp 20   Ht 5\' 9"  (1.753 m)   Wt 239 lb (108.4 kg)   SpO2 96%   BMI 35.29 kg/m   KUB- Kidney stones seen on left side but are int in ureter. Cannot see right side do to stool.-Preliminary reading by Ronnald Collum, FNP  [facility]  Urinalysis clear.    Assessment & Plan:  [name] comes in today with chief complaint of Possible kidney stone and Chest Pain   Diagnosis and  orders addressed:  1. Hematuria, unspecified type - KUB; Future - urinalysis- dip and micro  2. Chest pain, unspecified type Rest if reoccurs To ER if does not ease up Keep diary of episodes - EKG 12-Lead - Ambulatory referral to Cardiology  3. Acute bilateral thoracic back pain Moist heat rest - metaxalone (SKELAXIN) 800 MG tablet; Take 1 tablet (800 mg total) by mouth 3 (three) times daily.  Dispense: 60 tablet; Refill: 1    Follow up plan: prn   Mary-Margaret Daphine Deutscher, FNP

## 2019-08-16 NOTE — Patient Instructions (Signed)
Acute Back Pain, Adult Acute back pain is sudden and usually short-lived. It is often caused by an injury to the muscles and tissues in the back. The injury may result from:  A muscle or ligament getting overstretched or torn (strained). Ligaments are tissues that connect bones to each other. Lifting something improperly can cause a back strain.  Wear and tear (degeneration) of the spinal disks. Spinal disks are circular tissue that provides cushioning between the bones of the spine (vertebrae).  Twisting motions, such as while playing sports or doing yard work.  A hit to the back.  Arthritis. You may have a physical exam, lab tests, and imaging tests to find the cause of your pain. Acute back pain usually goes away with rest and home care. Follow these instructions at home: Managing pain, stiffness, and swelling  Take over-the-counter and prescription medicines only as told by your health care provider.  Your health care provider may recommend applying ice during the first 24-48 hours after your pain starts. To do this: ? Put ice in a plastic bag. ? Place a towel between your skin and the bag. ? Leave the ice on for 20 minutes, 2-3 times a day.  If directed, apply heat to the affected area as often as told by your health care provider. Use the heat source that your health care provider recommends, such as a moist heat pack or a heating pad. ? Place a towel between your skin and the heat source. ? Leave the heat on for 20-30 minutes. ? Remove the heat if your skin turns bright red. This is especially important if you are unable to feel pain, heat, or cold. You have a greater risk of getting burned. Activity   Do not stay in bed. Staying in bed for more than 1-2 days can delay your recovery.  Sit up and stand up straight. Avoid leaning forward when you sit, or hunching over when you stand. ? If you work at a desk, sit close to it so you do not need to lean over. Keep your chin tucked  in. Keep your neck drawn back, and keep your elbows bent at a right angle. Your arms should look like the letter "L." ? Sit high and close to the steering wheel when you drive. Add lower back (lumbar) support to your car seat, if needed.  Take short walks on even surfaces as soon as you are able. Try to increase the length of time you walk each day.  Do not sit, drive, or stand in one place for more than 30 minutes at a time. Sitting or standing for long periods of time can put stress on your back.  Do not drive or use heavy machinery while taking prescription pain medicine.  Use proper lifting techniques. When you bend and lift, use positions that put less stress on your back: ? Bend your knees. ? Keep the load close to your body. ? Avoid twisting.  Exercise regularly as told by your health care provider. Exercising helps your back heal faster and helps prevent back injuries by keeping muscles strong and flexible.  Work with a physical therapist to make a safe exercise program, as recommended by your health care provider. Do any exercises as told by your physical therapist. Lifestyle  Maintain a healthy weight. Extra weight puts stress on your back and makes it difficult to have good posture.  Avoid activities or situations that make you feel anxious or stressed. Stress and anxiety increase muscle   tension and can make back pain worse. Learn ways to manage anxiety and stress, such as through exercise. General instructions  Sleep on a firm mattress in a comfortable position. Try lying on your side with your knees slightly bent. If you lie on your back, put a pillow under your knees.  Follow your treatment plan as told by your health care provider. This may include: ? Cognitive or behavioral therapy. ? Acupuncture or massage therapy. ? Meditation or yoga. Contact a health care provider if:  You have pain that is not relieved with rest or medicine.  You have increasing pain going down  into your legs or buttocks.  Your pain does not improve after 2 weeks.  You have pain at night.  You lose weight without trying.  You have a fever or chills. Get help right away if:  You develop new bowel or bladder control problems.  You have unusual weakness or numbness in your arms or legs.  You develop nausea or vomiting.  You develop abdominal pain.  You feel faint. Summary  Acute back pain is sudden and usually short-lived.  Use proper lifting techniques. When you bend and lift, use positions that put less stress on your back.  Take over-the-counter and prescription medicines and apply heat or ice as directed by your health care provider. This information is not intended to replace advice given to you by your health care provider. Make sure you discuss any questions you have with your health care provider. Document Revised: 11/09/2018 Document Reviewed: 03/04/2017 Elsevier Patient Education  2020 [employer]  

## 2019-08-17 ENCOUNTER — Telehealth: Payer: Self-pay | Admitting: Physician Assistant

## 2019-08-18 ENCOUNTER — Other Ambulatory Visit: Payer: Self-pay | Admitting: Physician Assistant

## 2019-08-18 DIAGNOSIS — N401 Enlarged prostate with lower urinary tract symptoms: Secondary | ICD-10-CM

## 2019-08-19 ENCOUNTER — Telehealth: Payer: Self-pay | Admitting: Physician Assistant

## 2019-08-23 ENCOUNTER — Other Ambulatory Visit: Payer: Self-pay | Admitting: Physician Assistant

## 2019-09-16 ENCOUNTER — Other Ambulatory Visit: Payer: Self-pay | Admitting: Physician Assistant

## 2019-10-11 IMAGING — DX DG CHEST 2V
2 series · 2 of 2 positions shown · non-contrast
Comparison: 04/13/2014

CLINICAL DATA: Shortness of breath, cough

EXAM:
CHEST - 2 VIEW

[chest pa]
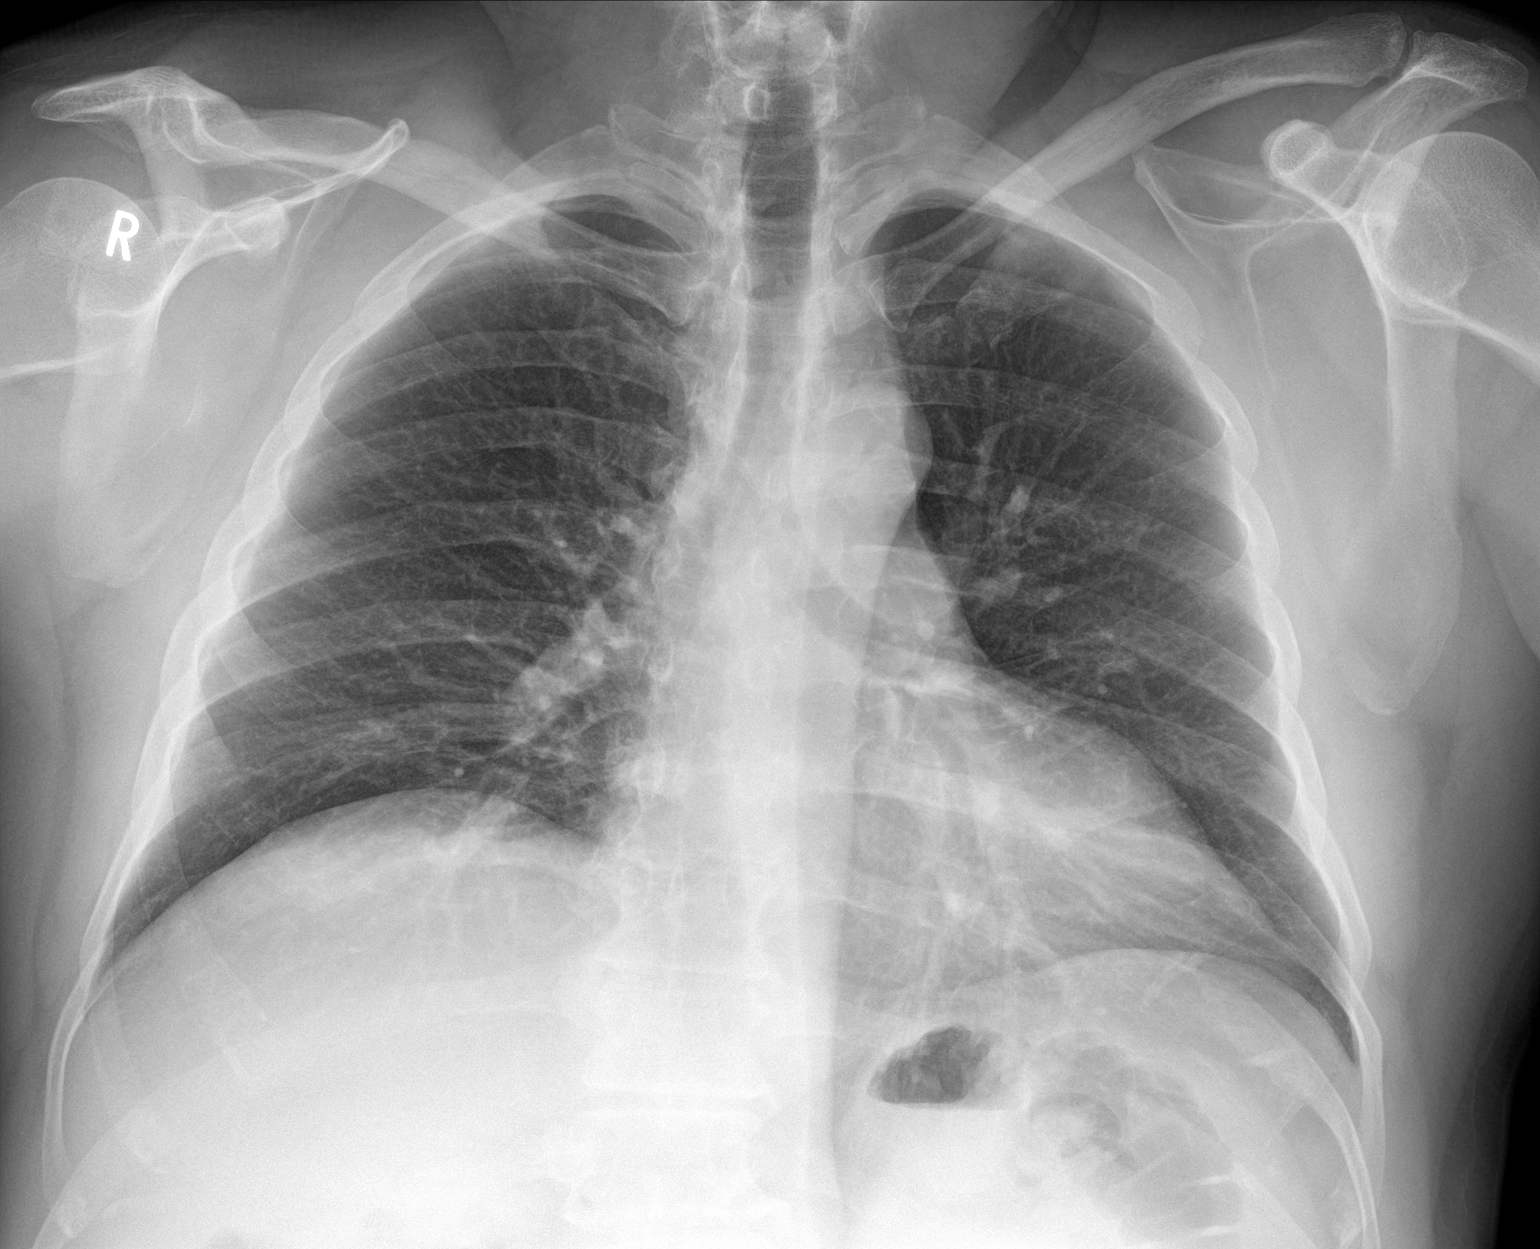

[chest lat]
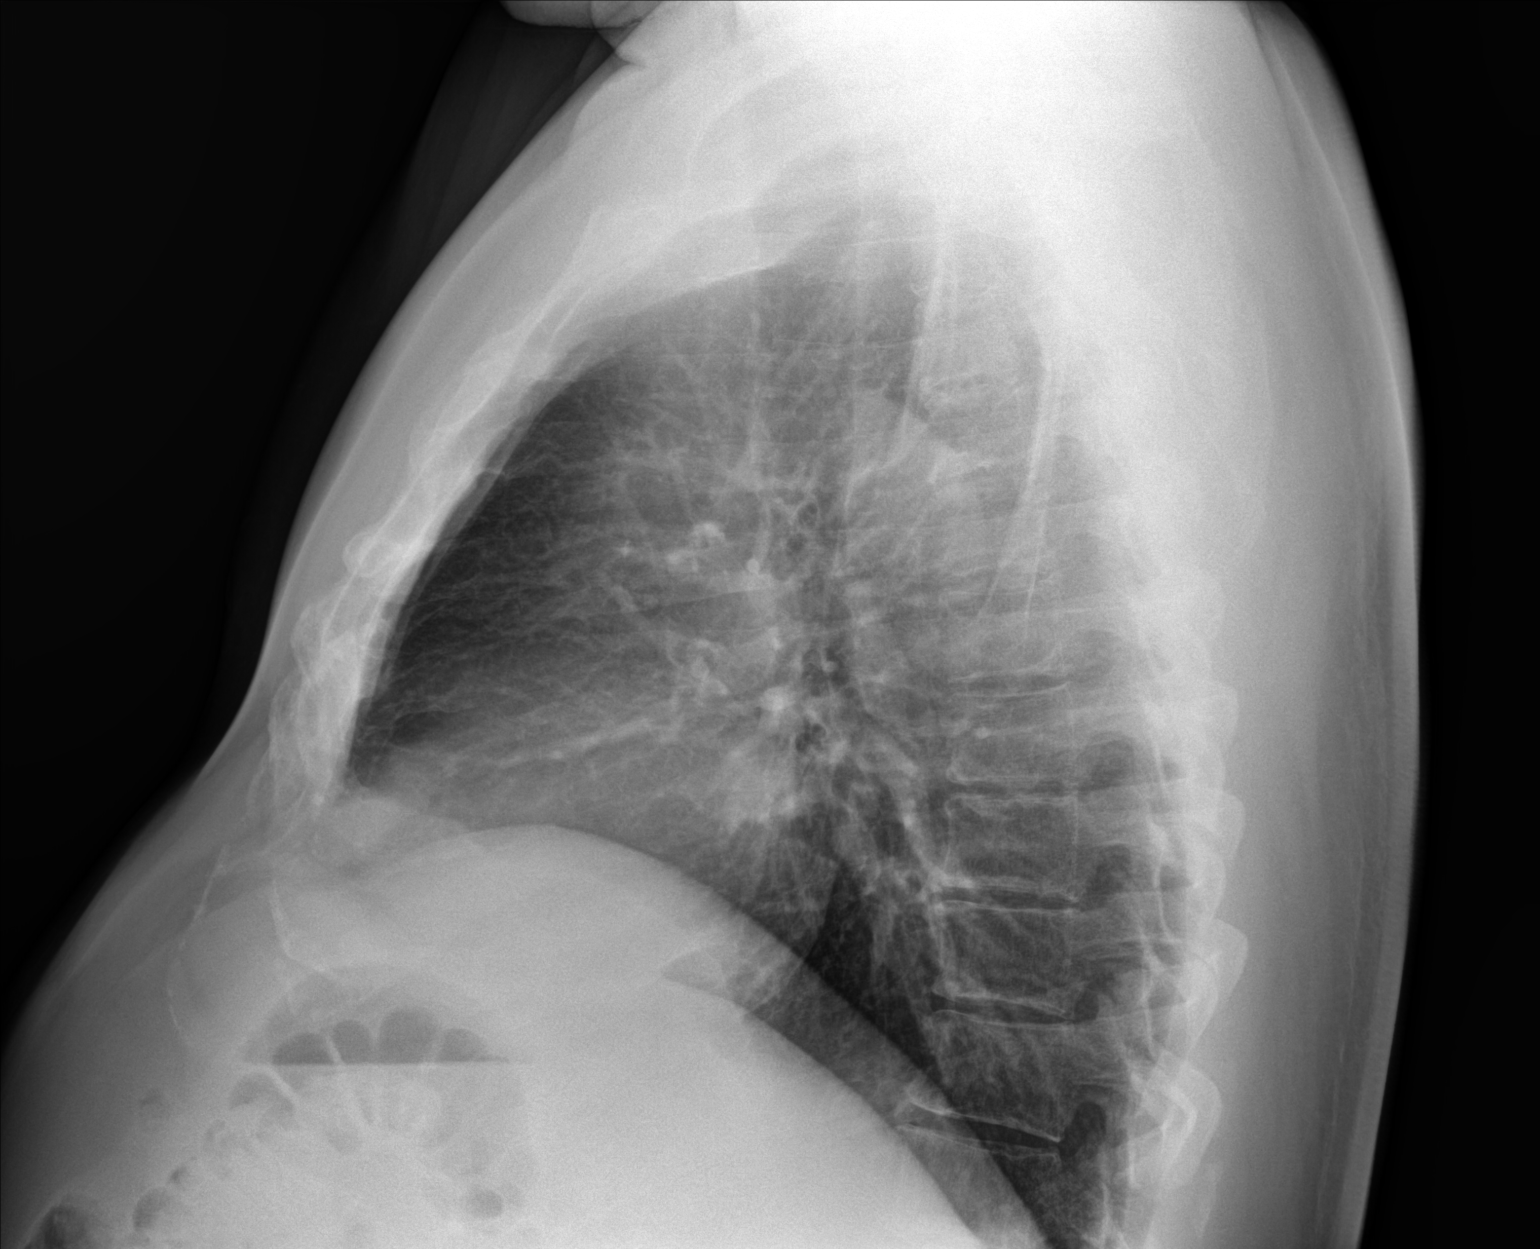

[2 of 2 positions shown; findings below may reference images not displayed]

FINDINGS: Lungs are clear.  No pleural effusion or pneumothorax.

The heart is normal in size.

Visualized osseous structures are within normal limits.
IMPRESSION: Normal chest radiographs.

## 2019-10-12 ENCOUNTER — Other Ambulatory Visit: Payer: Self-pay | Admitting: Physician Assistant

## 2019-10-12 DIAGNOSIS — N401 Enlarged prostate with lower urinary tract symptoms: Secondary | ICD-10-CM

## 2019-10-12 DIAGNOSIS — J301 Allergic rhinitis due to pollen: Secondary | ICD-10-CM

## 2019-11-09 ENCOUNTER — Other Ambulatory Visit: Payer: Self-pay | Admitting: *Deleted

## 2019-11-09 MED ORDER — LEVOCETIRIZINE DIHYDROCHLORIDE 5 MG PO TABS: 5.0000 mg | ORAL_TABLET | Freq: Every evening | ORAL | 2 refills | Status: AC

## 2020-02-15 ENCOUNTER — Ambulatory Visit
Admission: RE | Admit: 2020-02-15 | Discharge: 2020-02-15 | Disposition: A | Discharge status: Home / Self Care | Payer: 59 | Source: Ambulatory Visit | Attending: Physician Assistant | Admitting: Physician Assistant

## 2020-02-15 ENCOUNTER — Other Ambulatory Visit: Payer: Self-pay | Admitting: Physician Assistant

## 2020-02-15 DIAGNOSIS — R319 Hematuria, unspecified: Secondary | ICD-10-CM

## 2020-02-15 DIAGNOSIS — R109 Unspecified abdominal pain: Secondary | ICD-10-CM

## 2020-02-15 DIAGNOSIS — Z87442 Personal history of urinary calculi: Secondary | ICD-10-CM

## 2020-03-08 ENCOUNTER — Other Ambulatory Visit: Payer: Self-pay | Admitting: Family

## 2020-03-08 DIAGNOSIS — M109 Gout, unspecified: Secondary | ICD-10-CM

## 2020-07-18 ENCOUNTER — Ambulatory Visit (INDEPENDENT_AMBULATORY_CARE_PROVIDER_SITE_OTHER): Payer: BC Managed Care – PPO | Admitting: Orthopedic Surgery

## 2020-07-18 ENCOUNTER — Ambulatory Visit: Payer: Self-pay

## 2020-07-18 ENCOUNTER — Other Ambulatory Visit: Payer: Self-pay

## 2020-07-18 VITALS — Ht 69.0 in | Wt 246.0 lb

## 2020-07-18 DIAGNOSIS — M25562 Pain in left knee: Secondary | ICD-10-CM

## 2020-07-22 ENCOUNTER — Encounter: Payer: Self-pay | Admitting: Orthopedic Surgery
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
## 2020-08-15 ENCOUNTER — Other Ambulatory Visit: Payer: Self-pay | Admitting: Family

## 2021-02-12 ENCOUNTER — Ambulatory Visit (INDEPENDENT_AMBULATORY_CARE_PROVIDER_SITE_OTHER): Payer: BC Managed Care – PPO | Admitting: Surgical

## 2021-02-12 ENCOUNTER — Encounter: Payer: Self-pay | Admitting: Surgical

## 2021-02-12 ENCOUNTER — Other Ambulatory Visit: Payer: Self-pay

## 2021-02-12 DIAGNOSIS — M109 Gout, unspecified: Secondary | ICD-10-CM
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
## 2021-02-27 ENCOUNTER — Ambulatory Visit: Payer: BC Managed Care – PPO | Admitting: Orthopedic Surgery

## 2022-07-08 LAB — EXTERNAL GENERIC LAB PROCEDURE: COLOGUARD: NEGATIVE

## 2023-05-04 ENCOUNTER — Emergency Department (HOSPITAL_COMMUNITY)
Admission: EM | Admit: 2023-05-04 | Discharge: 2023-05-04 | Disposition: A | Discharge status: Home / Self Care | Payer: Worker's Compensation | Attending: Emergency Medicine | Admitting: Emergency Medicine

## 2023-05-04 ENCOUNTER — Emergency Department (HOSPITAL_COMMUNITY): Payer: Worker's Compensation

## 2023-05-04 DIAGNOSIS — W231XXA Caught, crushed, jammed, or pinched between stationary objects, initial encounter: Secondary | ICD-10-CM | POA: Insufficient documentation

## 2023-05-04 DIAGNOSIS — Z23 Encounter for immunization: Secondary | ICD-10-CM | POA: Diagnosis not present

## 2023-05-04 DIAGNOSIS — S68624A Partial traumatic transphalangeal amputation of right ring finger, initial encounter: Secondary | ICD-10-CM | POA: Diagnosis not present

## 2023-05-04 DIAGNOSIS — Y990 Civilian activity done for income or pay: Secondary | ICD-10-CM | POA: Insufficient documentation

## 2023-05-04 DIAGNOSIS — S68119A Complete traumatic metacarpophalangeal amputation of unspecified finger, initial encounter: Secondary | ICD-10-CM

## 2023-05-04 DIAGNOSIS — S6991XA Unspecified injury of right wrist, hand and finger(s), initial encounter: Secondary | ICD-10-CM | POA: Diagnosis present

## 2023-05-04 DIAGNOSIS — S62639A Displaced fracture of distal phalanx of unspecified finger, initial encounter for closed fracture: Secondary | ICD-10-CM

## 2023-05-04 DIAGNOSIS — Z7982 Long term (current) use of aspirin: Secondary | ICD-10-CM | POA: Diagnosis not present

## 2023-05-04 MED ORDER — KETOROLAC TROMETHAMINE 10 MG PO TABS: 10.0000 mg | ORAL_TABLET | Freq: Four times a day (QID) | ORAL | 0 refills | Status: DC | PRN

## 2023-05-04 MED ORDER — FENTANYL CITRATE PF 50 MCG/ML IJ SOSY
50.0000 ug | PREFILLED_SYRINGE | Freq: Once | INTRAMUSCULAR | Status: AC
  Administered 2023-05-04: 50 ug via INTRAVENOUS
  Filled 2023-05-04: qty 1

## 2023-05-04 MED ORDER — CEFAZOLIN SODIUM-DEXTROSE 1-4 GM/50ML-% IV SOLN
1.0000 g | Freq: Once | INTRAVENOUS | Status: AC
  Administered 2023-05-04: 1 g via INTRAVENOUS
  Filled 2023-05-04: qty 50

## 2023-05-04 MED ORDER — CEPHALEXIN 500 MG PO CAPS: 500.0000 mg | ORAL_CAPSULE | Freq: Four times a day (QID) | ORAL | 0 refills | Status: DC

## 2023-05-04 MED ORDER — KETOROLAC TROMETHAMINE 15 MG/ML IJ SOLN
15.0000 mg | Freq: Once | INTRAMUSCULAR | Status: AC
  Administered 2023-05-04: 15 mg via INTRAVENOUS
  Filled 2023-05-04: qty 1

## 2023-05-04 MED ORDER — TRAMADOL HCL 50 MG PO TABS
50.0000 mg | ORAL_TABLET | Freq: Four times a day (QID) | ORAL | 0 refills | Status: DC | PRN
Start: 2023-05-04 — End: 2023-06-20

## 2023-05-04 MED ORDER — TRAMADOL HCL 50 MG PO TABS
50.0000 mg | ORAL_TABLET | Freq: Once | ORAL | Status: AC
  Administered 2023-05-04: 50 mg via ORAL
  Filled 2023-05-04: qty 1

## 2023-05-04 MED ORDER — TETANUS-DIPHTH-ACELL PERTUSSIS 5-2.5-18.5 LF-MCG/0.5 IM SUSY
0.5000 mL | PREFILLED_SYRINGE | Freq: Once | INTRAMUSCULAR | Status: AC
  Administered 2023-05-04: 0.5 mL via INTRAMUSCULAR
  Filled 2023-05-04: qty 0.5

## 2023-05-04 NOTE — ED Notes (Signed)
Pt's finger soaking in iodine and saline.

## 2023-05-04 NOTE — ED Notes (Signed)
Pt's hand wrapped in xeroform and gauze per MD.
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
## 2023-05-04 NOTE — Discharge Instructions (Addendum)
Your x-ray imaging did show a slight tuft fracture of the distal phalanx of your finger.  You sustained a traumatic amputation of the tip of your finger.  You are provided with antibiotics and tetanus was updated.  We have provided you with pain medication.  The wound was cleaned and dressed by nursing in the emergency department.  Recommend you follow-up closely with Dr. Yehuda Budd of hand surgery for repeat assessment in clinic tomorrow afternoon. Toradol is a nonsteroidal anti-inflammatory which has been prescribed.  Tramadol is an opiate which has been prescribed.

## 2023-06-20 ENCOUNTER — Emergency Department (HOSPITAL_COMMUNITY): Payer: Self-pay

## 2023-06-20 ENCOUNTER — Other Ambulatory Visit: Payer: Self-pay

## 2023-06-20 ENCOUNTER — Emergency Department (HOSPITAL_COMMUNITY)
Admission: EM | Admit: 2023-06-20 | Discharge: 2023-06-20 | Disposition: A | Discharge status: Home / Self Care | Payer: Self-pay | Attending: Emergency Medicine | Admitting: Emergency Medicine

## 2023-06-20 ENCOUNTER — Encounter (HOSPITAL_COMMUNITY): Payer: Self-pay

## 2023-06-20 DIAGNOSIS — I10 Essential (primary) hypertension: Secondary | ICD-10-CM | POA: Diagnosis not present

## 2023-06-20 DIAGNOSIS — H81399 Other peripheral vertigo, unspecified ear: Secondary | ICD-10-CM | POA: Diagnosis not present

## 2023-06-20 DIAGNOSIS — R42 Dizziness and giddiness: Secondary | ICD-10-CM | POA: Diagnosis present

## 2023-06-20 DIAGNOSIS — E876 Hypokalemia: Secondary | ICD-10-CM | POA: Diagnosis not present

## 2023-06-20 LAB — COMPREHENSIVE METABOLIC PANEL
ALT: 20 U/L (ref 0–44)
AST: 22 U/L (ref 15–41)
Albumin: 4.1 g/dL (ref 3.5–5.0)
Alkaline Phosphatase: 83 U/L (ref 38–126)
Anion gap: 15 (ref 5–15)
BUN: 12 mg/dL (ref 6–20)
CO2: 17 mmol/L — ABNORMAL LOW (ref 22–32)
Calcium: 9.2 mg/dL (ref 8.9–10.3)
Chloride: 108 mmol/L (ref 98–111)
Creatinine, Ser: 1.13 mg/dL (ref 0.61–1.24)
GFR, Estimated: >60 mL/min (ref >60)
Glucose, Bld: 183 mg/dL — ABNORMAL HIGH (ref 70–99)
Potassium: 3 mmol/L — ABNORMAL LOW (ref 3.5–5.1)
Sodium: 140 mmol/L (ref 135–145)
Total Bilirubin: 0.7 mg/dL (ref <1.2)
Total Protein: 7.1 g/dL (ref 6.5–8.1)

## 2023-06-20 LAB — CBC WITH DIFFERENTIAL/PLATELET
Abs Immature Granulocytes: 0.03 10*3/uL (ref 0.00–0.07)
Basophils Absolute: 0.1 10*3/uL (ref 0.0–0.1)
Basophils Relative: 1 %
Eosinophils Absolute: 0.2 10*3/uL (ref 0.0–0.5)
Eosinophils Relative: 1 %
HCT: 41.9 % (ref 39.0–52.0)
Hemoglobin: 13.8 g/dL (ref 13.0–17.0)
Immature Granulocytes: 0 %
Lymphocytes Relative: 25 %
Lymphs Abs: 2.7 10*3/uL (ref 0.7–4.0)
MCH: 24.7 pg — ABNORMAL LOW (ref 26.0–34.0)
MCHC: 32.9 g/dL (ref 30.0–36.0)
MCV: 75.1 fL — ABNORMAL LOW (ref 80.0–100.0)
Monocytes Absolute: 0.7 10*3/uL (ref 0.1–1.0)
Monocytes Relative: 7 %
Neutro Abs: 7 10*3/uL (ref 1.7–7.7)
Neutrophils Relative %: 66 %
Platelets: 298 10*3/uL (ref 150–400)
RBC: 5.58 MIL/uL (ref 4.22–5.81)
RDW: 13.7 % (ref 11.5–15.5)
WBC: 10.6 10*3/uL — ABNORMAL HIGH (ref 4.0–10.5)
nRBC: 0 % (ref 0.0–0.2)

## 2023-06-20 LAB — TROPONIN I (HIGH SENSITIVITY): Troponin I (High Sensitivity): 4 ng/L (ref <18)

## 2023-06-20 MED ORDER — POTASSIUM CHLORIDE ER 10 MEQ PO TBCR: 10.0000 meq | EXTENDED_RELEASE_TABLET | Freq: Every day | ORAL | 0 refills | Status: AC

## 2023-06-20 MED ORDER — MECLIZINE HCL 25 MG PO TABS: 25.0000 mg | ORAL_TABLET | Freq: Three times a day (TID) | ORAL | 0 refills | Status: AC | PRN

## 2023-06-20 MED ORDER — SODIUM CHLORIDE 0.9 % IV BOLUS
500.0000 mL | Freq: Once | INTRAVENOUS | Status: AC
  Administered 2023-06-20: 500 mL via INTRAVENOUS

## 2023-06-20 MED ORDER — ONDANSETRON 4 MG PO TBDP: 4.0000 mg | ORAL_TABLET | Freq: Three times a day (TID) | ORAL | 0 refills | Status: AC | PRN

## 2023-06-20 MED ORDER — POTASSIUM CHLORIDE 20 MEQ PO PACK
40.0000 meq | PACK | Freq: Once | ORAL | Status: AC
  Administered 2023-06-20: 40 meq via ORAL
  Filled 2023-06-20: qty 2

## 2023-06-20 MED ORDER — HYDROCHLOROTHIAZIDE 12.5 MG PO TABS: 12.5000 mg | ORAL_TABLET | Freq: Every day | ORAL | 0 refills | Status: AC

## 2023-06-20 MED ORDER — ONDANSETRON HCL 4 MG/2ML IJ SOLN
4.0000 mg | Freq: Once | INTRAMUSCULAR | Status: AC
  Administered 2023-06-20: 4 mg via INTRAVENOUS
  Filled 2023-06-20: qty 2

## 2023-06-20 MED ORDER — POTASSIUM CHLORIDE 10 MEQ/100ML IV SOLN
10.0000 meq | Freq: Once | INTRAVENOUS | Status: AC
  Administered 2023-06-20: 10 meq via INTRAVENOUS
  Filled 2023-06-20: qty 100

## 2023-06-20 MED ORDER — DIAZEPAM 5 MG/ML IJ SOLN
2.5000 mg | Freq: Once | INTRAMUSCULAR | Status: AC
  Administered 2023-06-20: 2.5 mg via INTRAVENOUS
  Filled 2023-06-20: qty 2

## 2023-06-20 NOTE — Discharge Instructions (Addendum)
Your testing today showed no signs of stroke, your blood work showed that your potassium was low, I would like for you to take the following medications  If your blood pressure remains severely elevated after taking your nighttime medications you will need to start taking hydrochlorothiazide once a day.  I want you to take a potassium supplement once a day for the neck 7 days, meclizine can be taken every 8 hours as needed for severe nausea and dizziness, Zofran can be taken every 6 hours for nausea.  Drink plenty of clear liquids and stay at home for the next couple of days making sure that you are not driving.  You should see your doctor within 1 week for recheck of your potassium and a repeat evaluation but come back to the ER for severe worsening symptoms including increasing neurologic symptoms such as changes in vision speech strength or sensation, or severe vertigo.  Please be aware that the vertigo will get worse when you try to move around
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
## 2023-06-20 NOTE — ED Notes (Signed)
Pt is having some periods of apnea, pt desatted down into the 70s, pt arouses easily, pt placed on 4L via [place], and eco2 monitor, charge rn and md notified

## 2023-06-20 NOTE — ED Notes (Signed)
Pt awake, family at bedside, pt states that he no longer has the numbness in his fingers and is less dizzy, per tech at bedside pt is no longer having the episodes of apnea, sig other at bedside states that pt has a hx of sleep apnea and used to use a cpap.
